# Patient Record
Sex: Female | Born: 2000
Health system: Southern US, Community
[De-identification: ages and names within clinical notes are randomized; demographics above are authoritative.]

## PROBLEM LIST (undated history)

## (undated) DIAGNOSIS — Z889 Allergy status to unspecified drugs, medicaments and biological substances status: Secondary | ICD-10-CM

## (undated) DIAGNOSIS — D691 Qualitative platelet defects: Secondary | ICD-10-CM

---
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
## 2018-11-13 ENCOUNTER — Encounter (HOSPITAL_COMMUNITY): Payer: Self-pay

## 2018-11-13 ENCOUNTER — Ambulatory Visit (INDEPENDENT_AMBULATORY_CARE_PROVIDER_SITE_OTHER): Payer: BC Managed Care – PPO

## 2018-11-13 ENCOUNTER — Ambulatory Visit (HOSPITAL_COMMUNITY)
Admission: EM | Admit: 2018-11-13 | Discharge: 2018-11-13 | Disposition: A | Discharge status: Home / Self Care | Payer: BC Managed Care – PPO | Attending: Family Medicine | Admitting: Family Medicine

## 2018-11-13 ENCOUNTER — Other Ambulatory Visit: Payer: Self-pay

## 2018-11-13 DIAGNOSIS — K5900 Constipation, unspecified: Secondary | ICD-10-CM | POA: Diagnosis not present

## 2018-11-13 DIAGNOSIS — Z3202 Encounter for pregnancy test, result negative: Secondary | ICD-10-CM

## 2018-11-13 DIAGNOSIS — R109 Unspecified abdominal pain: Secondary | ICD-10-CM | POA: Diagnosis not present

## 2018-11-13 DIAGNOSIS — R1084 Generalized abdominal pain: Secondary | ICD-10-CM | POA: Diagnosis not present

## 2018-11-13 LAB — POCT URINALYSIS DIP (DEVICE)
Bilirubin Urine: NEGATIVE
Glucose, UA: NEGATIVE mg/dL
Hgb urine dipstick: NEGATIVE
Ketones, ur: NEGATIVE mg/dL
Leukocytes,Ua: NEGATIVE
Nitrite: NEGATIVE
Protein, ur: NEGATIVE mg/dL
Specific Gravity, Urine: >=1.03 (ref 1.005–1.030)
Urobilinogen, UA: 1 mg/dL (ref 0.0–1.0)
pH: 6 (ref 5.0–8.0)

## 2018-11-13 LAB — POCT PREGNANCY, URINE: Preg Test, Ur: NEGATIVE

## 2018-11-13 NOTE — ED Triage Notes (Signed)
Pt state she has had a stomach pains x 1 week.

## 2018-11-13 NOTE — Discharge Instructions (Addendum)
You may try over-the-counter Miralax and Magnesium Citrate as directed on the packaging.  You have been seen today for abdominal pain. Your evaluation was not suggestive of any emergent condition requiring medical intervention at this time. However, some abdominal problems make take more time to appear. Therefore, it is very important for you to pay attention to any new symptoms or worsening of your current condition.  Please return here or to the Emergency Department immediately should you begin to feel worse in any way or have any of the following symptoms: increasing or different abdominal pain, persistent vomiting, inability to drink fluids, fevers, or shaking chills.

## 2018-11-13 NOTE — ED Provider Notes (Signed)
[facility]   [id number] 11/13/18 Arrival Time: 5809  ASSESSMENT & PLAN:  1. Generalized abdominal pain   2. Constipation, unspecified constipation type    I have personally viewed the imaging studies ordered this visit. No worrisome findings. Much stool.  Benign abdominal exam. No indications for urgent abdominal/pelvic imaging at this time. Discussed.   Discharge Instructions     You may try over-the-counter Miralax and Magnesium Citrate as directed on the packaging.  You have been seen today for abdominal pain. Your evaluation was not suggestive of any emergent condition requiring medical intervention at this time. However, some abdominal problems make take more time to appear. Therefore, it is very important for you to pay attention to any new symptoms or worsening of your current condition.  Please return here or to the Emergency Department immediately should you begin to feel worse in any way or have any of the following symptoms: increasing or different abdominal pain, persistent vomiting, inability to drink fluids, fevers, or shaking chills.        Reviewed expectations re: course of current medical issues. Questions answered. Outlined signs and symptoms indicating need for more acute intervention. Patient verbalized understanding. After Visit Summary given.   SUBJECTIVE: History from: patient. Gina Zimmerman is a 18 y.o. female who presents with complaint of intermittent generalized abdominal discomfort. Onset gradual, 5-6 days ago. Discomfort described as "full feeling"; without radiation. Symptoms are unchanged since beginning. Slightly better after a smaller-than-normal bowel movements several days ago. Fever: absent. Aggravating factors: have not been identified. Alleviating factors: have not been identified. Associated symptoms: mild fatigue. She denies arthralgias, diarrhea, fever, headache, myalgias, nausea, sweats and vomiting. Appetite: normal. PO  intake: normal. Ambulatory without assistance. Urinary symptoms: none. Bowel movements: are less frequent than before; last normal bowel movement just before current symptoms started; without blood. No new medications. History of similar: none reported. OTC treatment: Miralax; one dose.  Patient's last menstrual period was 11/02/2018.   History reviewed. No pertinent surgical history.  ROS: As per HPI. All other systems negative.  OBJECTIVE:  Vitals:   11/13/18 1147 11/13/18 1151  BP:  121/76  Pulse:  86  Resp:  16  Temp:  98.6 F (37 C)  TempSrc:  Oral  SpO2:  100%  Weight: 72.6 kg     General appearance: alert, oriented, no acute distress  Oropharynx: moist Lungs: clear to auscultation bilaterally; unlabored respirations Heart: regular rate and rhythm Abdomen: soft; without distention; mild generalized tenderness - "just feels sore"; normal bowel sounds; without masses or organomegaly; without guarding or rebound tenderness Back: without CVA tenderness; FROM at waist Extremities: without LE edema; symmetrical; without gross deformities Skin: warm and dry Neurologic: normal gait Psychological: alert and cooperative; normal mood and affect  Labs: Results for orders placed or performed during the hospital encounter of 11/13/18  POCT urinalysis dip (device)  Result Value Ref Range   Glucose, UA NEGATIVE NEGATIVE mg/dL   Bilirubin Urine NEGATIVE NEGATIVE   Ketones, ur NEGATIVE NEGATIVE mg/dL   Specific Gravity, Urine >=1.030 1.005 - 1.030   Hgb urine dipstick NEGATIVE NEGATIVE   pH 6.0 5.0 - 8.0   Protein, ur NEGATIVE NEGATIVE mg/dL   Urobilinogen, UA 1.0 0.0 - 1.0 mg/dL   Nitrite NEGATIVE NEGATIVE   Leukocytes,Ua NEGATIVE NEGATIVE  Pregnancy, urine POC  Result Value Ref Range   Preg Test, Ur NEGATIVE NEGATIVE   Labs Reviewed  POC URINE PREG, ED  POCT URINALYSIS DIP (DEVICE)  POCT PREGNANCY,  URINE    No Known Allergies                                              PMH: Constipation  Social History   Socioeconomic History   Marital status: Single    Spouse name: Not on file   Number of children: Not on file   Years of education: Not on file   Highest education level: Not on file  Occupational History   Not on file  Social Needs   Financial resource strain: Not on file   Food insecurity    Worry: Not on file    Inability: Not on file   Transportation needs    Medical: Not on file    Non-medical: Not on file  Tobacco Use   Smoking status: Never Smoker   Smokeless tobacco: Never Used  Substance and Sexual Activity   Alcohol use: Yes   Drug use: No   Sexual activity: Yes    Birth control/protection: Implant  Lifestyle   Physical activity    Days per week: Not on file    Minutes per session: Not on file   Stress: Not on file  Relationships   Social connections    Talks on phone: Not on file    Gets together: Not on file    Attends religious service: Not on file    Active member of club or organization: Not on file    Attends meetings of clubs or organizations: Not on file    Relationship status: Not on file   Intimate partner violence    Fear of current or ex partner: Not on file    Emotionally abused: Not on file    Physically abused: Not on file    Forced sexual activity: Not on file  Other Topics Concern   Not on file  Social History Narrative   Not on file   FH: HTN   Mardella LaymanHagler, Dorthula Bier, MD 11/13/18 1317

## 2018-12-05 ENCOUNTER — Other Ambulatory Visit: Payer: Self-pay

## 2018-12-05 ENCOUNTER — Encounter (HOSPITAL_COMMUNITY): Payer: Self-pay

## 2018-12-05 ENCOUNTER — Ambulatory Visit (HOSPITAL_COMMUNITY)
Admission: EM | Admit: 2018-12-05 | Discharge: 2018-12-05 | Disposition: A | Discharge status: Home / Self Care | Payer: BC Managed Care – PPO | Attending: Emergency Medicine | Admitting: Emergency Medicine

## 2018-12-05 DIAGNOSIS — R1084 Generalized abdominal pain: Secondary | ICD-10-CM | POA: Diagnosis not present

## 2018-12-05 LAB — POCT URINALYSIS DIP (DEVICE)
Bilirubin Urine: NEGATIVE
Glucose, UA: NEGATIVE mg/dL
Hgb urine dipstick: NEGATIVE
Ketones, ur: NEGATIVE mg/dL
Leukocytes,Ua: NEGATIVE
Nitrite: NEGATIVE
Protein, ur: NEGATIVE mg/dL
Specific Gravity, Urine: >=1.03 (ref 1.005–1.030)
Urobilinogen, UA: 1 mg/dL (ref 0.0–1.0)
pH: 6 (ref 5.0–8.0)

## 2018-12-05 NOTE — Discharge Instructions (Addendum)
Your urine test is normal today.    Your vaginal swab for STDs is pending.  If anything comes back positive indicating treatment, we will call you.    Establish yourself with a primary care provider to receive additional evaluation as needed.    Go to the emergency department if you develop acute abdominal pain.  Return here or go to the emergency department if you develop vomiting, diarrhea, fever, difficulty with urination, back pain, vaginal discharge, pelvic pain, other concerning symptoms.

## 2018-12-05 NOTE — ED Notes (Signed)
Sent to bathroom for urine and vaginal swabbing with instructions

## 2018-12-05 NOTE — ED Provider Notes (Signed)
[name]    CSN: [id number] Arrival date & time: 12/05/18  1354     History   Chief Complaint Chief Complaint  Patient presents with  . Abdominal Pain    HPI Gina Zimmerman is a 18 y.o. female.   Patient presents with generalized abdominal pain and constipation x2 weeks; This is been an intermittent problem for her for the last 2 years.  She states the pain is an "ache" and is relieved with a bowel movement.  Last bowel movement: Yesterday; brown, formed, no blood.  She denies nausea, vomiting, diarrhea, fever, dysuria, flank pain, vaginal discharge, pelvic pain.  LMP: 11/30/2018.  She states she did have unprotected sex about 3 weeks ago.    The history is provided by the patient.    History reviewed. No pertinent past medical history.  There are no active problems to display for this patient.   History reviewed. No pertinent surgical history.  OB History   No obstetric history on file.      Home Medications    Prior to Admission medications   Medication Sig Start Date End Date Taking? Authorizing Provider  famotidine (PEPCID) 20 MG tablet Take 1 tablet (20 mg total) by mouth 2 (two) times daily. 01/23/17   Virgel Manifold, MD  ondansetron (ZOFRAN) 4 MG tablet Take 1 tablet (4 mg total) by mouth every 6 (six) hours. 01/23/17   Virgel Manifold, MD    Family History History reviewed. No pertinent family history.  Social History Social History   Tobacco Use  . Smoking status: Never Smoker  . Smokeless tobacco: Never Used  Substance Use Topics  . Alcohol use: Yes  . Drug use: No     Allergies   Patient has no known allergies.   Review of Systems Review of Systems  Constitutional: Negative for chills and fever.  HENT: Negative for ear pain and sore throat.   Eyes: Negative for pain and visual disturbance.  Respiratory: Negative for cough and shortness of breath.   Cardiovascular: Negative for chest pain and palpitations.  Gastrointestinal:  Positive for abdominal pain and constipation. Negative for blood in stool and vomiting.  Genitourinary: Negative for dysuria, flank pain, hematuria, pelvic pain and vaginal discharge.  Musculoskeletal: Negative for arthralgias and back pain.  Skin: Negative for color change and rash.  Neurological: Negative for seizures and syncope.  All other systems reviewed and are negative.    Physical Exam Triage Vital Signs ED Triage Vitals  Enc Vitals Group     BP 12/05/18 1459 134/81     Pulse Rate 12/05/18 1459 78     Resp 12/05/18 1459 14     Temp 12/05/18 1459 98.1 F (36.7 C)     Temp Source 12/05/18 1459 Oral     SpO2 12/05/18 1459 100 %     Weight 12/05/18 1509 160 lb (72.6 kg)     Height --      Head Circumference --      Peak Flow --      Pain Score 12/05/18 1509 7     Pain Loc --      Pain Edu? --      Excl. in [place]? --    No data found.  Updated Vital Signs BP 134/81 (BP Location: Left Arm)   Pulse 78   Temp 98.1 F (36.7 C) (Oral)   Resp 14   Wt 160 lb (72.6 kg)   LMP 11/30/2018   SpO2 100%  BMI 28.34 kg/m   Visual Acuity Right Eye Distance:   Left Eye Distance:   Bilateral Distance:    Right Eye Near:   Left Eye Near:    Bilateral Near:     Physical Exam Vitals signs and nursing note reviewed.  Constitutional:      General: She is not in acute distress.    Appearance: She is well-developed.  HENT:     Head: Normocephalic and atraumatic.     Mouth/Throat:     Mouth: Mucous membranes are moist.  Eyes:     Conjunctiva/sclera: Conjunctivae normal.  Neck:     Musculoskeletal: Neck supple.  Cardiovascular:     Rate and Rhythm: Normal rate and regular rhythm.     Heart sounds: No murmur.  Pulmonary:     Effort: Pulmonary effort is normal. No respiratory distress.     Breath sounds: Normal breath sounds.  Abdominal:     Palpations: Abdomen is soft.     Tenderness: There is no abdominal tenderness. There is no right CVA tenderness, left CVA  tenderness, guarding or rebound.  Skin:    General: Skin is warm and dry.  Neurological:     Mental Status: She is alert.      UC Treatments / Results  Labs (all labs ordered are listed, but only abnormal results are displayed) Labs Reviewed  POCT URINALYSIS DIP (DEVICE)  CERVICOVAGINAL ANCILLARY ONLY    EKG   Radiology No results found.  Procedures Procedures (including critical care time)  Medications Ordered in UC Medications - No data to display  Initial Impression / Assessment and Plan / UC Course  I have reviewed the triage vital signs and the nursing notes.  Pertinent labs & imaging results that were available during my care of the patient were reviewed by me and considered in my medical decision making (see chart for details).   Abdominal pain, intermittent and chronic.  Exam unremarkable.  Urine negative.  Vaginal swab sent for gonorrhea, chlamydia, trichomonas, BV.  Low suspicion for STD; patient declines treatment today; she states she will wait for test results to come back and receive treatment then if needed.  Instructed patient to abstain from sex until her STD test are back.;  Discussed that her sexual partner will need treatment also if her test are positive.  Instructed patient to go to the emergency department if she develops acute abdominal pain.  Discussed that she needed to establish herself with a primary care provider to receive additional evaluation is needed.  Instructed patient to return here or go to the emergency department if she develops vomiting, diarrhea, fever, dysuria, back pain, vaginal discharge, pelvic pain, other concerning symptoms.      Final Clinical Impressions(s) / UC Diagnoses   Final diagnoses:  Generalized abdominal pain     Discharge Instructions     Your urine test is normal today.    Your vaginal swab for STDs is pending.  If anything comes back positive indicating treatment, we will call you.    Establish yourself  with a primary care provider to receive additional evaluation as needed.    Go to the emergency department if you develop acute abdominal pain.  Return here or go to the emergency department if you develop vomiting, diarrhea, fever, difficulty with urination, back pain, vaginal discharge, pelvic pain, other concerning symptoms.        ED Prescriptions    None     Controlled Substance Prescriptions [place] Controlled Substance Registry  consulted? Not Applicable   Mickie Bailate, Halima Fogal H, NP 12/05/18 [phone]

## 2018-12-05 NOTE — ED Triage Notes (Signed)
Pt states she has stomach pain x 2 weeks.

## 2018-12-07 LAB — CERVICOVAGINAL ANCILLARY ONLY
Bacterial vaginitis: NEGATIVE
Chlamydia: NEGATIVE
Neisseria Gonorrhea: NEGATIVE
Trichomonas: NEGATIVE

## 2019-04-18 ENCOUNTER — Other Ambulatory Visit: Payer: Self-pay

## 2019-04-18 DIAGNOSIS — Z20822 Contact with and (suspected) exposure to covid-19: Secondary | ICD-10-CM

## 2019-04-21 LAB — NOVEL CORONAVIRUS, NAA: SARS-CoV-2, NAA: NOT DETECTED

## 2019-04-24 DIAGNOSIS — Z1159 Encounter for screening for other viral diseases: Secondary | ICD-10-CM | POA: Diagnosis not present

## 2019-04-24 DIAGNOSIS — Z20828 Contact with and (suspected) exposure to other viral communicable diseases: Secondary | ICD-10-CM | POA: Diagnosis not present

## 2019-05-21 DIAGNOSIS — Z20828 Contact with and (suspected) exposure to other viral communicable diseases: Secondary | ICD-10-CM | POA: Diagnosis not present

## 2019-05-21 DIAGNOSIS — Z1159 Encounter for screening for other viral diseases: Secondary | ICD-10-CM | POA: Diagnosis not present

## 2019-05-27 DIAGNOSIS — Z1159 Encounter for screening for other viral diseases: Secondary | ICD-10-CM | POA: Diagnosis not present

## 2019-05-27 DIAGNOSIS — Z20828 Contact with and (suspected) exposure to other viral communicable diseases: Secondary | ICD-10-CM | POA: Diagnosis not present

## 2019-06-03 DIAGNOSIS — Z1159 Encounter for screening for other viral diseases: Secondary | ICD-10-CM | POA: Diagnosis not present

## 2019-06-03 DIAGNOSIS — Z20828 Contact with and (suspected) exposure to other viral communicable diseases: Secondary | ICD-10-CM | POA: Diagnosis not present

## 2019-06-10 DIAGNOSIS — Z20828 Contact with and (suspected) exposure to other viral communicable diseases: Secondary | ICD-10-CM | POA: Diagnosis not present

## 2019-06-10 DIAGNOSIS — Z1159 Encounter for screening for other viral diseases: Secondary | ICD-10-CM | POA: Diagnosis not present

## 2019-06-17 DIAGNOSIS — Z1159 Encounter for screening for other viral diseases: Secondary | ICD-10-CM | POA: Diagnosis not present

## 2019-06-17 DIAGNOSIS — Z20828 Contact with and (suspected) exposure to other viral communicable diseases: Secondary | ICD-10-CM | POA: Diagnosis not present

## 2019-06-18 ENCOUNTER — Emergency Department (HOSPITAL_COMMUNITY): Payer: BC Managed Care – PPO | Admitting: Certified Registered"

## 2019-06-18 ENCOUNTER — Emergency Department (HOSPITAL_COMMUNITY): Payer: BC Managed Care – PPO

## 2019-06-18 ENCOUNTER — Other Ambulatory Visit: Payer: Self-pay

## 2019-06-18 ENCOUNTER — Ambulatory Visit (HOSPITAL_COMMUNITY)
Admission: EM | Admit: 2019-06-18 | Discharge: 2019-06-18 | Disposition: A | Discharge status: Home / Self Care | Payer: BC Managed Care – PPO | Source: Home / Self Care

## 2019-06-18 ENCOUNTER — Ambulatory Visit (HOSPITAL_COMMUNITY)
Admission: EM | Admit: 2019-06-18 | Discharge: 2019-06-18 | Disposition: A | Discharge status: Home / Self Care | Payer: BC Managed Care – PPO | Attending: Surgery | Admitting: Surgery

## 2019-06-18 ENCOUNTER — Encounter (HOSPITAL_COMMUNITY)
Admission: EM | Disposition: A | Discharge status: Home / Self Care | Payer: Self-pay | Source: Home / Self Care | Attending: Emergency Medicine

## 2019-06-18 ENCOUNTER — Encounter (HOSPITAL_COMMUNITY): Payer: Self-pay

## 2019-06-18 ENCOUNTER — Encounter (HOSPITAL_COMMUNITY): Payer: Self-pay | Admitting: Emergency Medicine

## 2019-06-18 DIAGNOSIS — Z3202 Encounter for pregnancy test, result negative: Secondary | ICD-10-CM

## 2019-06-18 DIAGNOSIS — K353 Acute appendicitis with localized peritonitis, without perforation or gangrene: Secondary | ICD-10-CM

## 2019-06-18 DIAGNOSIS — R112 Nausea with vomiting, unspecified: Secondary | ICD-10-CM

## 2019-06-18 DIAGNOSIS — R1013 Epigastric pain: Secondary | ICD-10-CM

## 2019-06-18 DIAGNOSIS — R1011 Right upper quadrant pain: Secondary | ICD-10-CM | POA: Diagnosis not present

## 2019-06-18 DIAGNOSIS — R109 Unspecified abdominal pain: Secondary | ICD-10-CM | POA: Diagnosis not present

## 2019-06-18 DIAGNOSIS — K3580 Unspecified acute appendicitis: Secondary | ICD-10-CM | POA: Insufficient documentation

## 2019-06-18 DIAGNOSIS — Z20822 Contact with and (suspected) exposure to covid-19: Secondary | ICD-10-CM | POA: Insufficient documentation

## 2019-06-18 HISTORY — PX: LAPAROSCOPIC APPENDECTOMY: SHX408

## 2019-06-18 LAB — COMPREHENSIVE METABOLIC PANEL
ALT: 98 U/L — ABNORMAL HIGH (ref 0–44)
AST: 53 U/L — ABNORMAL HIGH (ref 15–41)
Albumin: 4.3 g/dL (ref 3.5–5.0)
Alkaline Phosphatase: 122 U/L (ref 38–126)
Anion gap: 11 (ref 5–15)
BUN: 8 mg/dL (ref 6–20)
CO2: 24 mmol/L (ref 22–32)
Calcium: 9.6 mg/dL (ref 8.9–10.3)
Chloride: 103 mmol/L (ref 98–111)
Creatinine, Ser: 0.68 mg/dL (ref 0.44–1.00)
GFR calc Af Amer: >60 mL/min (ref >60)
GFR calc non Af Amer: >60 mL/min (ref >60)
Glucose, Bld: 113 mg/dL — ABNORMAL HIGH (ref 70–99)
Potassium: 3.7 mmol/L (ref 3.5–5.1)
Sodium: 138 mmol/L (ref 135–145)
Total Bilirubin: 0.6 mg/dL (ref 0.3–1.2)
Total Protein: 7.8 g/dL (ref 6.5–8.1)

## 2019-06-18 LAB — URINALYSIS, ROUTINE W REFLEX MICROSCOPIC
Bacteria, UA: NONE SEEN
Bilirubin Urine: NEGATIVE
Glucose, UA: NEGATIVE mg/dL
Hgb urine dipstick: NEGATIVE
Ketones, ur: NEGATIVE mg/dL
Leukocytes,Ua: NEGATIVE
Nitrite: NEGATIVE
Protein, ur: NEGATIVE mg/dL
Specific Gravity, Urine: 1.021 (ref 1.005–1.030)
pH: 5 (ref 5.0–8.0)

## 2019-06-18 LAB — I-STAT BETA HCG BLOOD, ED (MC, WL, AP ONLY): I-stat hCG, quantitative: <5 m[IU]/mL (ref <5)

## 2019-06-18 LAB — POCT URINALYSIS DIP (DEVICE)
Bilirubin Urine: NEGATIVE
Glucose, UA: NEGATIVE mg/dL
Hgb urine dipstick: NEGATIVE
Ketones, ur: NEGATIVE mg/dL
Leukocytes,Ua: NEGATIVE
Nitrite: NEGATIVE
Protein, ur: NEGATIVE mg/dL
Specific Gravity, Urine: >=1.03 (ref 1.005–1.030)
Urobilinogen, UA: 0.2 mg/dL (ref 0.0–1.0)
pH: 5 (ref 5.0–8.0)

## 2019-06-18 LAB — CBC
HCT: 43.4 % (ref 36.0–46.0)
Hemoglobin: 13.5 g/dL (ref 12.0–15.0)
MCH: 25.2 pg — ABNORMAL LOW (ref 26.0–34.0)
MCHC: 31.1 g/dL (ref 30.0–36.0)
MCV: 81 fL (ref 80.0–100.0)
Platelets: 739 10*3/uL — ABNORMAL HIGH (ref 150–400)
RBC: 5.36 MIL/uL — ABNORMAL HIGH (ref 3.87–5.11)
RDW: 21 % — ABNORMAL HIGH (ref 11.5–15.5)
WBC: 15.5 10*3/uL — ABNORMAL HIGH (ref 4.0–10.5)
nRBC: 0.2 % (ref 0.0–0.2)

## 2019-06-18 LAB — LIPASE, BLOOD: Lipase: 17 U/L (ref 11–51)

## 2019-06-18 LAB — RESPIRATORY PANEL BY RT PCR (FLU A&B, COVID)
Influenza A by PCR: NEGATIVE
Influenza B by PCR: NEGATIVE
SARS Coronavirus 2 by RT PCR: NEGATIVE

## 2019-06-18 LAB — POCT PREGNANCY, URINE: Preg Test, Ur: NEGATIVE

## 2019-06-18 LAB — POC URINE PREG, ED: Preg Test, Ur: NEGATIVE

## 2019-06-18 SURGERY — APPENDECTOMY, LAPAROSCOPIC
Anesthesia: General | Site: Abdomen

## 2019-06-18 MED ORDER — PROPOFOL 10 MG/ML IV BOLUS
INTRAVENOUS | Status: AC
  Filled 2019-06-18: qty 20

## 2019-06-18 MED ORDER — SCOPOLAMINE 1 MG/3DAYS TD PT72: 1.0000 | MEDICATED_PATCH | TRANSDERMAL | Status: DC

## 2019-06-18 MED ORDER — SUCCINYLCHOLINE 20MG/ML (10ML) SYRINGE FOR MEDFUSION PUMP - OPTIME
INTRAMUSCULAR | Status: DC | PRN
  Administered 2019-06-18: 140 mg via INTRAVENOUS

## 2019-06-18 MED ORDER — SODIUM CHLORIDE 0.9 % IR SOLN
Status: DC | PRN
  Administered 2019-06-18: 1000 mL

## 2019-06-18 MED ORDER — MORPHINE SULFATE (PF) 2 MG/ML IV SOLN: 1.0000 mg | INTRAVENOUS | Status: DC | PRN

## 2019-06-18 MED ORDER — BUPIVACAINE HCL (PF) 0.25 % IJ SOLN
INTRAMUSCULAR | Status: AC
  Filled 2019-06-18: qty 30

## 2019-06-18 MED ORDER — ALUM & MAG HYDROXIDE-SIMETH 200-200-20 MG/5ML PO SUSP
30.0000 mL | Freq: Once | ORAL | Status: AC
  Administered 2019-06-18: 30 mL via ORAL
  Filled 2019-06-18: qty 30

## 2019-06-18 MED ORDER — SODIUM CHLORIDE 0.9 % IV BOLUS
1000.0000 mL | Freq: Once | INTRAVENOUS | Status: AC
  Administered 2019-06-18: 1000 mL via INTRAVENOUS

## 2019-06-18 MED ORDER — DOCUSATE SODIUM 100 MG PO CAPS: 100.0000 mg | ORAL_CAPSULE | Freq: Two times a day (BID) | ORAL | 0 refills | Status: AC

## 2019-06-18 MED ORDER — SODIUM CHLORIDE 0.9 % IV SOLN
2.0000 g | Freq: Once | INTRAVENOUS | Status: AC
  Administered 2019-06-18: 17:00:00 2 g via INTRAVENOUS
  Filled 2019-06-18: qty 20

## 2019-06-18 MED ORDER — SCOPOLAMINE 1 MG/3DAYS TD PT72
MEDICATED_PATCH | TRANSDERMAL | Status: AC
  Administered 2019-06-18: 1.5 mg via TRANSDERMAL
  Filled 2019-06-18: qty 1

## 2019-06-18 MED ORDER — ACETAMINOPHEN 650 MG RE SUPP: 650.0000 mg | RECTAL | Status: DC | PRN

## 2019-06-18 MED ORDER — ROCURONIUM 10MG/ML (10ML) SYRINGE FOR MEDFUSION PUMP - OPTIME
INTRAVENOUS | Status: DC | PRN
  Administered 2019-06-18: 30 mg via INTRAVENOUS

## 2019-06-18 MED ORDER — GLYCOPYRROLATE 0.2 MG/ML IJ SOLN
INTRAMUSCULAR | Status: DC | PRN
  Administered 2019-06-18: .2 mg via INTRAVENOUS

## 2019-06-18 MED ORDER — PROMETHAZINE HCL 25 MG/ML IJ SOLN: 6.2500 mg | INTRAMUSCULAR | Status: DC | PRN

## 2019-06-18 MED ORDER — LACTATED RINGERS IV SOLN: INTRAVENOUS | Status: DC | PRN

## 2019-06-18 MED ORDER — AMOXICILLIN-POT CLAVULANATE 875-125 MG PO TABS: 1.0000 | ORAL_TABLET | Freq: Two times a day (BID) | ORAL | 0 refills | Status: AC

## 2019-06-18 MED ORDER — FENTANYL CITRATE (PF) 100 MCG/2ML IJ SOLN: 25.0000 ug | INTRAMUSCULAR | Status: DC | PRN

## 2019-06-18 MED ORDER — SUGAMMADEX SODIUM 200 MG/2ML IV SOLN
INTRAVENOUS | Status: DC | PRN
  Administered 2019-06-18: 200 mg via INTRAVENOUS

## 2019-06-18 MED ORDER — 0.9 % SODIUM CHLORIDE (POUR BTL) OPTIME
TOPICAL | Status: DC | PRN
  Administered 2019-06-18: 1000 mL

## 2019-06-18 MED ORDER — METOCLOPRAMIDE HCL 5 MG/ML IJ SOLN
INTRAMUSCULAR | Status: AC
  Filled 2019-06-18: qty 2

## 2019-06-18 MED ORDER — ONDANSETRON HCL 4 MG/2ML IJ SOLN
INTRAMUSCULAR | Status: AC
  Filled 2019-06-18: qty 2

## 2019-06-18 MED ORDER — METOCLOPRAMIDE HCL 5 MG/ML IJ SOLN
10.0000 mg | INTRAMUSCULAR | Status: AC
  Administered 2019-06-18: 09:00:00 10 mg via INTRAMUSCULAR

## 2019-06-18 MED ORDER — SODIUM CHLORIDE 0.9% FLUSH: 3.0000 mL | INTRAVENOUS | Status: DC | PRN

## 2019-06-18 MED ORDER — ONDANSETRON 4 MG PO TBDP
4.0000 mg | ORAL_TABLET | Freq: Once | ORAL | Status: AC
  Administered 2019-06-18: 09:00:00 4 mg via ORAL

## 2019-06-18 MED ORDER — ACETAMINOPHEN 500 MG PO TABS
1000.0000 mg | ORAL_TABLET | ORAL | Status: DC
  Filled 2019-06-18: qty 2

## 2019-06-18 MED ORDER — FENTANYL CITRATE (PF) 250 MCG/5ML IJ SOLN
INTRAMUSCULAR | Status: DC | PRN
  Administered 2019-06-18 (×3): 50 ug via INTRAVENOUS

## 2019-06-18 MED ORDER — LIDOCAINE HCL (CARDIAC) PF 100 MG/5ML IV SOSY
PREFILLED_SYRINGE | INTRAVENOUS | Status: DC | PRN
  Administered 2019-06-18: 100 mg via INTRATRACHEAL

## 2019-06-18 MED ORDER — BUPIVACAINE HCL (PF) 0.25 % IJ SOLN
INTRAMUSCULAR | Status: DC | PRN
  Administered 2019-06-18: 14 mL

## 2019-06-18 MED ORDER — ONDANSETRON HCL 4 MG/2ML IJ SOLN
4.0000 mg | Freq: Once | INTRAMUSCULAR | Status: AC
  Administered 2019-06-18: 10:00:00 4 mg via INTRAVENOUS
  Filled 2019-06-18: qty 2

## 2019-06-18 MED ORDER — FENTANYL CITRATE (PF) 250 MCG/5ML IJ SOLN
INTRAMUSCULAR | Status: AC
  Filled 2019-06-18: qty 5

## 2019-06-18 MED ORDER — ACETAMINOPHEN 325 MG PO TABS
650.0000 mg | ORAL_TABLET | Freq: Once | ORAL | Status: AC
  Administered 2019-06-18: 650 mg via ORAL
  Filled 2019-06-18: qty 2

## 2019-06-18 MED ORDER — CELECOXIB 200 MG PO CAPS: 400.0000 mg | ORAL_CAPSULE | Freq: Once | ORAL | Status: DC

## 2019-06-18 MED ORDER — ONDANSETRON HCL 4 MG/2ML IJ SOLN
INTRAMUSCULAR | Status: DC | PRN
  Administered 2019-06-18: 4 mg via INTRAVENOUS

## 2019-06-18 MED ORDER — ONDANSETRON HCL 4 MG/2ML IJ SOLN
4.0000 mg | INTRAMUSCULAR | Status: DC | PRN
  Administered 2019-06-18: 4 mg via INTRAVENOUS

## 2019-06-18 MED ORDER — MIDAZOLAM HCL 2 MG/2ML IJ SOLN
INTRAMUSCULAR | Status: DC | PRN
  Administered 2019-06-18: 2 mg via INTRAVENOUS

## 2019-06-18 MED ORDER — CELECOXIB 200 MG PO CAPS
200.0000 mg | ORAL_CAPSULE | ORAL | Status: AC
  Administered 2019-06-18: 200 mg via ORAL
  Filled 2019-06-18 (×2): qty 1

## 2019-06-18 MED ORDER — CELECOXIB 200 MG PO CAPS
200.0000 mg | ORAL_CAPSULE | Freq: Once | ORAL | Status: AC
  Filled 2019-06-18: qty 1

## 2019-06-18 MED ORDER — CELECOXIB 200 MG PO CAPS
ORAL_CAPSULE | ORAL | Status: AC
  Administered 2019-06-18: 200 mg via ORAL
  Filled 2019-06-18: qty 1

## 2019-06-18 MED ORDER — MIDAZOLAM HCL 2 MG/2ML IJ SOLN
INTRAMUSCULAR | Status: AC
  Filled 2019-06-18: qty 2

## 2019-06-18 MED ORDER — SODIUM CHLORIDE 0.9% FLUSH: 3.0000 mL | Freq: Two times a day (BID) | INTRAVENOUS | Status: DC

## 2019-06-18 MED ORDER — METRONIDAZOLE IN NACL 5-0.79 MG/ML-% IV SOLN
500.0000 mg | Freq: Once | INTRAVENOUS | Status: AC
  Administered 2019-06-18: 500 mg via INTRAVENOUS
  Filled 2019-06-18: qty 100

## 2019-06-18 MED ORDER — PHENYLEPHRINE 40 MCG/ML (10ML) SYRINGE FOR IV PUSH (FOR BLOOD PRESSURE SUPPORT)
PREFILLED_SYRINGE | INTRAVENOUS | Status: AC
  Filled 2019-06-18: qty 10

## 2019-06-18 MED ORDER — DEXAMETHASONE SODIUM PHOSPHATE 10 MG/ML IJ SOLN
INTRAMUSCULAR | Status: DC | PRN
  Administered 2019-06-18: 10 mg via INTRAVENOUS

## 2019-06-18 MED ORDER — CHLORHEXIDINE GLUCONATE CLOTH 2 % EX PADS: 6.0000 | MEDICATED_PAD | Freq: Once | CUTANEOUS | Status: DC

## 2019-06-18 MED ORDER — PROPOFOL 10 MG/ML IV BOLUS
INTRAVENOUS | Status: DC | PRN
  Administered 2019-06-18: 200 mg via INTRAVENOUS

## 2019-06-18 MED ORDER — SODIUM CHLORIDE 0.9% FLUSH
3.0000 mL | Freq: Once | INTRAVENOUS | Status: AC
  Administered 2019-06-18: 3 mL via INTRAVENOUS

## 2019-06-18 MED ORDER — STERILE WATER FOR IRRIGATION IR SOLN
Status: DC | PRN
  Administered 2019-06-18: 1000 mL

## 2019-06-18 MED ORDER — SODIUM CHLORIDE 0.9 % IV SOLN: INTRAVENOUS | Status: DC

## 2019-06-18 MED ORDER — SODIUM CHLORIDE 0.9 % IV SOLN
INTRAVENOUS | Status: DC
  Filled 2019-06-18: qty 1000

## 2019-06-18 MED ORDER — ACETAMINOPHEN 500 MG PO TABS
1000.0000 mg | ORAL_TABLET | Freq: Once | ORAL | Status: AC
  Administered 2019-06-18: 1000 mg via ORAL

## 2019-06-18 MED ORDER — IOHEXOL 300 MG/ML  SOLN
100.0000 mL | Freq: Once | INTRAMUSCULAR | Status: AC | PRN
  Administered 2019-06-18: 100 mL via INTRAVENOUS

## 2019-06-18 MED ORDER — ONDANSETRON 4 MG PO TBDP: 4.0000 mg | ORAL_TABLET | Freq: Three times a day (TID) | ORAL | 0 refills | Status: DC | PRN

## 2019-06-18 MED ORDER — OXYCODONE HCL 5 MG PO TABS: 5.0000 mg | ORAL_TABLET | ORAL | Status: DC | PRN

## 2019-06-18 MED ORDER — ONDANSETRON 4 MG PO TBDP
ORAL_TABLET | ORAL | Status: AC
  Filled 2019-06-18: qty 1

## 2019-06-18 MED ORDER — SODIUM CHLORIDE 0.9 % IV SOLN: 250.0000 mL | INTRAVENOUS | Status: DC | PRN

## 2019-06-18 MED ORDER — TRAMADOL HCL 50 MG PO TABS: 50.0000 mg | ORAL_TABLET | Freq: Four times a day (QID) | ORAL | 0 refills | Status: AC | PRN

## 2019-06-18 MED ORDER — ACETAMINOPHEN 325 MG PO TABS: 650.0000 mg | ORAL_TABLET | ORAL | Status: DC | PRN

## 2019-06-18 MED ORDER — GABAPENTIN 300 MG PO CAPS
300.0000 mg | ORAL_CAPSULE | ORAL | Status: AC
  Administered 2019-06-18: 300 mg via ORAL
  Filled 2019-06-18: qty 1

## 2019-06-18 SURGICAL SUPPLY — 37 items
APPLIER CLIP 5 13 M/L LIGAMAX5 (MISCELLANEOUS)
BLADE CLIPPER SURG (BLADE) IMPLANT
CANISTER SUCT 3000ML PPV (MISCELLANEOUS) ×3 IMPLANT
CHLORAPREP W/TINT 26 (MISCELLANEOUS) ×3 IMPLANT
CLIP APPLIE 5 13 M/L LIGAMAX5 (MISCELLANEOUS) IMPLANT
COVER SURGICAL LIGHT HANDLE (MISCELLANEOUS) ×3 IMPLANT
COVER WAND RF STERILE (DRAPES) ×3 IMPLANT
CUTTER FLEX LINEAR 45M (STAPLE) ×3 IMPLANT
DERMABOND ADVANCED (GAUZE/BANDAGES/DRESSINGS) ×2
DERMABOND ADVANCED .7 DNX12 (GAUZE/BANDAGES/DRESSINGS) ×1 IMPLANT
ELECT REM PT RETURN 9FT ADLT (ELECTROSURGICAL) ×3
ELECTRODE REM PT RTRN 9FT ADLT (ELECTROSURGICAL) ×1 IMPLANT
GOWN STRL REUS W/ TWL LRG LVL3 (GOWN DISPOSABLE) ×2 IMPLANT
GOWN STRL REUS W/TWL LRG LVL3 (GOWN DISPOSABLE) ×4
GRASPER SUT TROCAR 14GX15 (MISCELLANEOUS) ×3 IMPLANT
KIT BASIN OR (CUSTOM PROCEDURE TRAY) ×3 IMPLANT
KIT TURNOVER KIT B (KITS) ×3 IMPLANT
NEEDLE INSUFFLATION 14GA 120MM (NEEDLE) ×3 IMPLANT
NS IRRIG 1000ML POUR BTL (IV SOLUTION) ×3 IMPLANT
PAD ARMBOARD 7.5X6 YLW CONV (MISCELLANEOUS) ×6 IMPLANT
POUCH SPECIMEN RETRIEVAL 10MM (ENDOMECHANICALS) ×3 IMPLANT
RELOAD 45 VASCULAR/THIN (ENDOMECHANICALS) ×3 IMPLANT
RELOAD STAPLE TA45 3.5 REG BLU (ENDOMECHANICALS) IMPLANT
SCISSORS LAP 5X35 DISP (ENDOMECHANICALS) IMPLANT
SET IRRIG TUBING LAPAROSCOPIC (IRRIGATION / IRRIGATOR) ×3 IMPLANT
SET TUBE SMOKE EVAC HIGH FLOW (TUBING) ×3 IMPLANT
SHEARS HARMONIC ACE PLUS 36CM (ENDOMECHANICALS) ×3 IMPLANT
SLEEVE ENDOPATH XCEL 5M (ENDOMECHANICALS) ×3 IMPLANT
SPECIMEN JAR SMALL (MISCELLANEOUS) ×3 IMPLANT
SUT MNCRL AB 4-0 PS2 18 (SUTURE) ×3 IMPLANT
TOWEL GREEN STERILE FF (TOWEL DISPOSABLE) ×3 IMPLANT
TRAY FOL W/BAG SLVR 16FR STRL (SET/KITS/TRAYS/PACK) ×1 IMPLANT
TRAY FOLEY W/BAG SLVR 16FR LF (SET/KITS/TRAYS/PACK) ×2
TRAY LAPAROSCOPIC MC (CUSTOM PROCEDURE TRAY) ×3 IMPLANT
TROCAR XCEL 12X100 BLDLESS (ENDOMECHANICALS) ×3 IMPLANT
TROCAR XCEL NON-BLD 5MMX100MML (ENDOMECHANICALS) ×3 IMPLANT
WATER STERILE IRR 1000ML POUR (IV SOLUTION) ×3 IMPLANT

## 2019-06-18 NOTE — Discharge Instructions (Signed)
[facility] ______[facility] °LAPAROSCOPIC SURGERY: POST OP INSTRUCTIONS °Always review your discharge instruction sheet given to you by the facility where your surgery was performed. °IF YOU HAVE DISABILITY OR FAMILY LEAVE FORMS, YOU MUST BRING THEM TO THE OFFICE FOR PROCESSING.   °DO NOT GIVE THEM TO YOUR DOCTOR. ° °1. A prescription for pain medication may be given to you upon discharge.  Take your pain medication as prescribed, if needed.  If narcotic pain medicine is not needed, then you may take acetaminophen (Tylenol) or ibuprofen (Advil) as needed. °2. Take your usually prescribed medications unless otherwise directed. °3. If you need a refill on your pain medication, please contact your pharmacy.  They will contact our office to request authorization. Prescriptions will not be filled after 5pm or on week-ends. °4. You should follow a light diet the first few days after arrival home, such as soup and crackers, etc.  Be sure to include lots of fluids daily. °5. Most patients will experience some swelling and bruising in the area of the incisions.  Ice packs will help.  Swelling and bruising can take several days to resolve.  °6. It is common to experience some constipation if taking pain medication after surgery.  Increasing fluid intake and taking a stool softener (such as Colace) will usually help or prevent this problem from occurring.  A mild laxative (Milk of Magnesia or Miralax) should be taken according to package instructions if there are no bowel movements after 48 hours. °7. Unless discharge instructions indicate otherwise, you may remove your bandages 24-48 hours after surgery, and you may shower at that time.  You may have steri-strips (small skin tapes) in place directly over the incision.  These strips should be left on the skin for 7-10 days.  If your surgeon used skin glue on the incision, you may shower in 24 hours.  The glue will flake off over the next 2-3 weeks.  Any sutures or  staples will be removed at the office during your follow-up visit. °8. ACTIVITIES:  You may resume regular (light) daily activities beginning the next day--such as daily self-care, walking, climbing stairs--gradually increasing activities as tolerated.  You may have sexual intercourse when it is comfortable.  Refrain from any heavy lifting or straining until approved by your doctor. °a. You may drive when you are no longer taking prescription pain medication, you can comfortably wear a seatbelt, and you can safely maneuver your car and apply brakes. °b. RETURN TO WORK:  __1 week________________________________________________________ °9. You should see your doctor in the office for a follow-up appointment approximately 2-3 weeks after your surgery.  Make sure that you call for this appointment within a day or two after you arrive home to insure a convenient appointment time. °10. OTHER INSTRUCTIONS: __________________________________________________________________________________________________________________________ __________________________________________________________________________________________________________________________ °WHEN TO CALL YOUR DOCTOR: °1. Fever over 101.0 °2. Inability to urinate °3. Continued bleeding from incision. °4. Increased pain, redness, or drainage from the incision. °5. Increasing abdominal pain ° °The clinic staff is available to answer your questions during regular business hours.  Please don’t hesitate to call and ask to speak to one of the nurses for clinical concerns.  If you have a medical emergency, go to the nearest emergency room or call 911.  A surgeon from [facility] is always on call at the hospital. °[address], [address], [place], [place]  [address] ? [address] [address], [place], [place]   [address] °[phone] ? [phone] ? FAX [phone] °Web   site: [email or web address] ° °

## 2019-06-18 NOTE — ED Notes (Signed)
Pt ambulated to RR without difficulty 

## 2019-06-18 NOTE — Anesthesia Procedure Notes (Addendum)
Procedure Name: Intubation Date/Time: 06/18/2019 6:44 PM Performed by: Claris Che, CRNA Pre-anesthesia Checklist: Patient identified, Emergency Drugs available, Suction available, Patient being monitored and Timeout performed Patient Re-evaluated:Patient Re-evaluated prior to induction Oxygen Delivery Method: Circle system utilized Preoxygenation: Pre-oxygenation with 100% oxygen Induction Type: IV induction, Rapid sequence and Cricoid Pressure applied Laryngoscope Size: Mac and 3 Grade View: Grade II Tube type: Oral Tube size: 7.5 mm Number of attempts: 1 Airway Equipment and Method: Stylet Placement Confirmation: ETT inserted through vocal cords under direct vision,  positive ETCO2 and breath sounds checked- equal and bilateral Secured at: 23 cm Tube secured with: Tape Dental Injury: Teeth and Oropharynx as per pre-operative assessment

## 2019-06-18 NOTE — ED Provider Notes (Signed)
[facility] EMERGENCY DEPARTMENT Provider Note   CSN: [id number] Arrival date & time: 06/18/19  0915     History Chief Complaint  Patient presents with  . Abdominal Pain    Gina Zimmerman is a 19 y.o. female with no relevant PMH presents to the ED from the urgent care with a 1 week history of crampy upper abdominal discomfort that became severe in intensity last evening.  This morning she reports 10 out of 10 aching epigastric abdominal pain with associated nausea and nonbloody emesis.  She denies burning sensation.  No obvious aggravating or alleviating factors.  She received Reglan IM at the urgent care, with little effect.  She denies any headache or dizziness, fevers or chills, chest pain or shortness of breath, cough, urinary symptoms, vaginal bleeding or discharge, or any melena or other changes in bowel habits.  Patient denies any tobacco, alcohol, or illicit drug use.   HPI     History reviewed. No pertinent past medical history.  There are no problems to display for this patient.   History reviewed. No pertinent surgical history.   OB History   No obstetric history on file.     No family history on file.  Social History   Tobacco Use  . Smoking status: Never Smoker  . Smokeless tobacco: Never Used  Substance Use Topics  . Alcohol use: Yes  . Drug use: No    Home Medications Prior to Admission medications   Not on File    Allergies    Latex  Review of Systems   Review of Systems  All other systems reviewed and are negative.   Physical Exam Updated Vital Signs BP 127/88   Pulse (!) 105   Temp 98.1 F (36.7 C) (Oral)   Resp 16   LMP 05/22/2019   SpO2 100%   Physical Exam Vitals and nursing note reviewed. Exam conducted with a chaperone present.  Constitutional:      Appearance: Normal appearance. She is ill-appearing.  HENT:     Head: Normocephalic and atraumatic.  Eyes:     General: No scleral icterus.  Conjunctiva/sclera: Conjunctivae normal.  Cardiovascular:     Rate and Rhythm: Regular rhythm. Tachycardia present.     Pulses: Normal pulses.     Heart sounds: Normal heart sounds.  Pulmonary:     Effort: Pulmonary effort is normal. No respiratory distress.     Breath sounds: Normal breath sounds.  Abdominal:     Comments: Soft, nondistended.  Significant TTP in epigastric region, however no guarding.  Mild-moderate TTP in RUQ, mild RLQ.  No TTP elsewhere.  No overlying skin changes.  Normoactive bowel sounds.  No masses appreciated.   Musculoskeletal:     Cervical back: Normal range of motion and neck supple.  Skin:    General: Skin is dry.     Capillary Refill: Capillary refill takes less than 2 seconds.  Neurological:     General: No focal deficit present.     Mental Status: She is alert and oriented to person, place, and time.     GCS: GCS eye subscore is 4. GCS verbal subscore is 5. GCS motor subscore is 6.  Psychiatric:        Mood and Affect: Mood normal.        Behavior: Behavior normal.        Thought Content: Thought content normal.     ED Results / Procedures / Treatments   Labs (all labs ordered  are listed, but only abnormal results are displayed) Labs Reviewed  COMPREHENSIVE METABOLIC PANEL - Abnormal; Notable for the following components:      Result Value   Glucose, Bld 113 (*)    AST 53 (*)    ALT 98 (*)    All other components within normal limits  CBC - Abnormal; Notable for the following components:   WBC 15.5 (*)    RBC 5.36 (*)    MCH 25.2 (*)    RDW 21.0 (*)    Platelets 739 (*)    All other components within normal limits  URINALYSIS, ROUTINE W REFLEX MICROSCOPIC - Abnormal; Notable for the following components:   APPearance TURBID (*)    All other components within normal limits  RESPIRATORY PANEL BY RT PCR (FLU A&B, COVID)  LIPASE, BLOOD  I-STAT BETA HCG BLOOD, ED ([facility], [facility], [facility] ONLY)    EKG None  Radiology CT ABDOMEN PELVIS W  CONTRAST  Result Date: 06/18/2019 CLINICAL DATA:  Right upper quadrant abdominal pain EXAM: CT ABDOMEN AND PELVIS WITH CONTRAST TECHNIQUE: Multidetector CT imaging of the abdomen and pelvis was performed using the standard protocol following bolus administration of intravenous contrast. CONTRAST:  165mL OMNIPAQUE IOHEXOL 300 MG/ML  SOLN COMPARISON:  None. FINDINGS: Lower chest: No acute abnormality. Hepatobiliary: No focal liver abnormality. The gallbladder appears normal. No biliary dilatation. Pancreas: Unremarkable. No pancreatic ductal dilatation or surrounding inflammatory changes. Spleen: Measures 6.8 x 10.6 by 15.4 cm (volume = 580 cm^3). No focal splenic abnormality. Adrenals/Urinary Tract: The adrenal glands appear normal. No mass or hydronephrosis. Urinary bladder appears normal. Stomach/Bowel: Stomach is within normal limits. The appendix is identified within the right lower quadrant of the abdomen and measures 9 mm in thickness. There is a small amount of free fluid within the right lower quadrant of the abdomen and subtle soft tissue stranding. No evidence for bowel perforation or abscess. Vascular/Lymphatic: No significant vascular findings are present. No enlarged abdominal or pelvic lymph nodes. Reproductive: Uterus unremarkable. Prominent cyst in the right ovary measures 2.8 cm, image 79/3. Other: No focal fluid collections identified. Musculoskeletal: No acute or significant osseous findings. IMPRESSION: 1. Imaging findings concerning for acute appendicitis. No evidence for perforation or abscess. Clinical correlation advised. 2. Splenomegaly. Electronically Signed   By: Kerby Moors M.D.   On: 06/18/2019 15:57   US Abdomen Limited  Result Date: 06/18/2019 CLINICAL DATA:  Right upper quadrant abdominal pain. EXAM: ULTRASOUND ABDOMEN LIMITED RIGHT UPPER QUADRANT COMPARISON:  None. FINDINGS: Gallbladder: No gallstones or wall thickening visualized. No sonographic Murphy sign noted by  sonographer. Common bile duct: Diameter: 2.8 mm Liver: Normal echogenicity without focal lesion or biliary dilatation. Portal vein is patent on color Doppler imaging with normal direction of blood flow towards the liver. Other: None. IMPRESSION: Normal right upper quadrant ultrasound examination. Electronically Signed   By: Marijo Sanes M.D.   On: 06/18/2019 12:31    Procedures Procedures (including critical care time)  Medications Ordered in ED Medications  cefTRIAXone (ROCEPHIN) 2 g in sodium chloride 0.9 % 100 mL IVPB (has no administration in time range)    And  metroNIDAZOLE (FLAGYL) IVPB 500 mg (has no administration in time range)  Chlorhexidine Gluconate Cloth 2 % PADS 6 each (has no administration in time range)    And  Chlorhexidine Gluconate Cloth 2 % PADS 6 each (has no administration in time range)  acetaminophen (TYLENOL) tablet 1,000 mg (has no administration in time range)  celecoxib (CELEBREX) capsule 200  mg (has no administration in time range)  gabapentin (NEURONTIN) capsule 300 mg (has no administration in time range)  sodium chloride 0.9 % 1,000 mL with potassium chloride 10 mEq infusion (has no administration in time range)  morphine 2 MG/ML injection 1-4 mg (has no administration in time range)  ondansetron (ZOFRAN) injection 4 mg (has no administration in time range)  sodium chloride flush (NS) 0.9 % injection 3 mL (3 mLs Intravenous Given 06/18/19 1019)  ondansetron (ZOFRAN) injection 4 mg (4 mg Intravenous Given 06/18/19 1019)  sodium chloride 0.9 % bolus 1,000 mL (0 mLs Intravenous Stopped 06/18/19 1205)  alum & mag hydroxide-simeth (MAALOX/MYLANTA) 200-200-20 MG/5ML suspension 30 mL (30 mLs Oral Given 06/18/19 1043)  acetaminophen (TYLENOL) tablet 650 mg (650 mg Oral Given 06/18/19 1256)  iohexol (OMNIPAQUE) 300 MG/ML solution 100 mL (100 mLs Intravenous Contrast Given 06/18/19 1504)  sodium chloride 0.9 % bolus 1,000 mL (1,000 mLs Intravenous New Bag/Given 06/18/19 1640)     ED Course  I have reviewed the triage vital signs and the nursing notes.  Pertinent labs & imaging results that were available during my care of the patient were reviewed by me and considered in my medical decision making (see chart for details).  Clinical Course as of Jun 18 1639  Tue Jun 18, 2019  1640 Spoke with general surgery who will evaluate and admit for appendectomy.    [GG]    Clinical Course User Index [GG] Lorelee New, PA-C   MDM Rules/Calculators/A&P                      Initially ordered Limited RUQ abdominal ultrasound given transaminitis and borderline alkaline phos in addition to her epigastric/RUQ tenderness to palpation.  She did have mild RLQ tenderness on exam, but most was epigastric.  CT abdomen pelvis demonstrated findings concerning for acute appendicitis, without evidence of abscess or perforation.  Started patient on IV ceftriaxone and metronidazole.  Will provide continued pain medication.  Ordered fluids.  2-hour PCR COVID-19 testing ordered and will contact general surgery for admission.  Spoke with general surgery who will evaluate patient and admit for appendectomy.   Final Clinical Impression(s) / ED Diagnoses Final diagnoses:  Acute appendicitis with localized peritonitis, without perforation, abscess, or gangrene    Rx / DC Orders ED Discharge Orders    None       Lorelee New, PA-C 06/18/19 1641    Benjiman Core, MD 06/19/19 [phone]

## 2019-06-18 NOTE — ED Triage Notes (Signed)
Pt presents with epigastric pain since last night with nausea and vomiting.

## 2019-06-18 NOTE — ED Provider Notes (Signed)
[place]    CSN: [id number] Arrival date & time: 06/18/19  0806      History   Chief Complaint Chief Complaint  Patient presents with  . Abdominal Pain    Epigastric    HPI Gina Zimmerman is a 19 y.o. female.   HPI Gina Zimmerman presents for evaluation of mid epigastric pain gradually worsening since last night.  Pain is most notable in the epigastric region of the abdomen however radiates to the periumbilicus.  Patient has a low-grade temperature although denies fever or chills.  Last menstrual period was 05/22/2019.  Denies any known COVID-19 contact.  At present right pain as a 10 out of 10 and is radiating to her mid back.  She was able to tolerate food yesterday however has not vomited since last night.  Abdominal pain was so intense last night she was unable to sleep.  She has not taken any medication at home.  She was vomiting on arrival here at urgent care.  She has been intolerant of fluids.  Attempted to give Zofran under the tongue patient vomited Zofran up as well.  Tachycardic and characterize current midepigastric pain as sharp. This is her third encounter at either urgent care or ER with a complaint of mid-epigastric pain with associated N&V. Patient has not had any GI follow-up. She is unable to associate onset of symptom with any type of food or activity. She denies tobacco, marijuana use, illicit drugs, or alcohol use.  Patient's last menstrual period was 05/22/2019. Denies any recent known exposure to persons Positive for COVID-19.  History reviewed. No pertinent past medical history.  There are no problems to display for this patient.   History reviewed. No pertinent surgical history.  OB History   No obstetric history on file.      Home Medications    Prior to Admission medications   Medication Sig Start Date End Date Taking? Authorizing Provider  famotidine (PEPCID) 20 MG tablet Take 1 tablet (20 mg total) by mouth 2 (two) times daily.  01/23/17   Virgel Manifold, MD  ondansetron (ZOFRAN) 4 MG tablet Take 1 tablet (4 mg total) by mouth every 6 (six) hours. 01/23/17   Virgel Manifold, MD    Family History History reviewed. No pertinent family history.  Social History Social History   Tobacco Use  . Smoking status: Never Smoker  . Smokeless tobacco: Never Used  Substance Use Topics  . Alcohol use: Yes  . Drug use: No     Allergies   Patient has no known allergies.   Review of Systems Review of Systems Pertinent negatives listed in HPI Physical Exam Triage Vital Signs ED Triage Vitals  Enc Vitals Group     BP 06/18/19 0829 114/72     Pulse Rate 06/18/19 0829 (!) 110     Resp 06/18/19 0829 17     Temp 06/18/19 0829 99 F (37.2 C)     Temp Source 06/18/19 0829 Oral     SpO2 06/18/19 0829 100 %     Weight --      Height --      Head Circumference --      Peak Flow --      Pain Score 06/18/19 0830 9     Pain Loc --      Pain Edu? --      Excl. in [place]? --    No data found.  Updated Vital Signs BP 114/72 (BP Location: Left Arm)  Pulse (!) 110   Temp 99 F (37.2 C) (Oral)   Resp 17   LMP 05/22/2019   SpO2 100%   Visual Acuity Right Eye Distance:   Left Eye Distance:   Bilateral Distance:    Right Eye Near:   Left Eye Near:    Bilateral Near:     Physical Exam Constitutional:      General: She is in acute distress.     Appearance: She is ill-appearing. She is not toxic-appearing.  Cardiovascular:     Rate and Rhythm: Tachycardia present.  Pulmonary:     Effort: Pulmonary effort is normal.     Breath sounds: Normal breath sounds.  Abdominal:     General: Bowel sounds are decreased.     Tenderness: There is abdominal tenderness in the epigastric area and periumbilical area. There is guarding. There is no right CVA tenderness, left CVA tenderness or rebound. Positive signs include Murphy's sign.    Skin:    General: Skin is warm and dry.  Neurological:     Mental Status: She is  oriented to person, place, and time.  Psychiatric:        Mood and Affect: Mood normal.      UC Treatments / Results  Labs (all labs ordered are listed, but only abnormal results are displayed) Labs Reviewed  POC URINE PREG, ED  POCT URINALYSIS DIP (DEVICE)    EKG   Radiology No results found.  Procedures Procedures (including critical care time)  Medications Ordered in UC Medications  metoCLOPramide (REGLAN) injection 10 mg (has no administration in time range)  ondansetron (ZOFRAN-ODT) disintegrating tablet 4 mg (4 mg Oral Given 06/18/19 0839)    Initial Impression / Assessment and Plan / UC Course  I have reviewed the triage vital signs and the nursing notes.  Pertinent labs & imaging results that were available during my care of the patient were reviewed by me and considered in my medical decision making (see chart for details).    During exam, patient continued to report gradually worsening abdominal pain with pain now distributing to mid upper back and characterized as sharp. Urine pregnancy negative. UA unremarkable with the exception of dehydration note. Patient will need at minimum CT of abdomen to rule out acute abdomen. Exam finding suspicious for gallbladder involvement given location and quality of pain.  Patient is being transported to ER     Final Clinical Impressions(s) / UC Diagnoses   Final diagnoses:  Abdominal pain, epigastric  Non-intractable vomiting with nausea, unspecified vomiting type   Discharge Instructions   None    ED Prescriptions    None     PDMP not reviewed this encounter.   Bing Neighbors, FNP 06/18/19 1355

## 2019-06-18 NOTE — Transfer of Care (Signed)
Immediate Anesthesia Transfer of Care Note  Patient: Gina Zimmerman  Procedure(s) Performed: APPENDECTOMY LAPAROSCOPIC (N/A Abdomen)  Patient Location: PACU  Anesthesia Type:General  Level of Consciousness: sedated, drowsy, patient cooperative and responds to stimulation  Airway & Oxygen Therapy: Patient Spontanous Breathing and Patient connected to nasal cannula oxygen  Post-op Assessment: Report given to RN, Post -op Vital signs reviewed and stable and Patient moving all extremities X 4  Post vital signs: Reviewed and stable  Last Vitals:  Vitals Value Taken Time  BP 131/77 06/18/19 1957  Temp 36.4 C 06/18/19 1956  Pulse 126 06/18/19 1959  Resp 22 06/18/19 1959  SpO2 99 % 06/18/19 1959  Vitals shown include unvalidated device data.  Last Pain:  Vitals:   06/18/19 1956  TempSrc:   PainSc: Asleep         Complications: No apparent anesthesia complications

## 2019-06-18 NOTE — H&P (Signed)
[facility] Admission Note  Gina Zimmerman 03-17-2001  [id number].    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 States that the pain is epigastric and started last night. Radiates to periumbilical region and is gradually getting worse. Associated multiple episodes of nausea and vomiting.  She denies any diarrhea or fevers.  She has no other associated symptoms. Initially went to urgent care who sent her to the ED for CT abdomen. In the ED blood work remarkable for WBC 15.5, AST 53, ALT 98. RUQ u/s normal. CT scan showed 65mm appendix with small amount of free fluid within the right lower quadrant of the abdomen and subtle soft tissue stranding, no evidence for perforation or abscess. General surgery asked to see.   ROS: ROS All systems reviewed and otherwise negative except for as above  No family history on file.  History reviewed. No pertinent past medical history. thrombocytopenia  History reviewed. No pertinent surgical history.  Social History:  reports that she has never smoked. She has never used smokeless tobacco. She reports current alcohol use. She reports that she does not use drugs.  Allergies:  Allergies  Allergen Reactions  . Latex Swelling    (Not in a hospital admission)   Prior to Admission medications   Not on File    Blood pressure 127/88, pulse (!) 105, temperature 98.1 F (36.7 C), temperature source Oral, resp. rate 16, last menstrual period 05/22/2019, SpO2 100 %. Physical Exam: General: pleasant, WD/WN AA female who is laying in bed in NAD HEENT: head is normocephalic, atraumatic.  Sclera are noninjected.  PERRL.  Ears and nose without any masses or lesions.  Mouth is pink and moist.  Dentition fair Heart: regular rhythm, but slightly tachy.  Normal s1,s2. No obvious murmurs, gallops, or rubs noted.  Palpable pedal pulses bilaterally  Lungs: CTAB, no wheezes, rhonchi, or rales noted.  Respiratory effort nonlabored Abd: soft, mildly tender in RLQ at McBurney's point.  No guarding or rebounding/ND, +BS, no masses, hernias, or organomegaly MS: no BUE/BLE edema, calves soft and nontender Skin: warm and dry with no masses, lesions, or rashes Psych: A&Ox4 with an appropriate affect Neuro: cranial nerves grossly intact, equal strength in BUE/BLE bilaterally, normal speech, though process intact  Results for orders placed or performed during the hospital encounter of 06/18/19 (from the past 48 hour(s))  Lipase, blood     Status: None   Collection Time: 06/18/19 10:04 AM  Result Value Ref Range   Lipase 17 11 - 51 U/L    Comment: Performed at [facility], [address], [place], [place] [address]  Comprehensive metabolic panel     Status: Abnormal   Collection Time: 06/18/19 10:04 AM  Result Value Ref Range   Sodium 138 135 - 145 mmol/L   Potassium 3.7 3.5 - 5.1 mmol/L   Chloride 103 98 - 111 mmol/L   CO2 24 22 - 32 mmol/L   Glucose, Bld 113 (H) 70 - 99 mg/dL   BUN 8 6 - 20 mg/dL   Creatinine, Ser 7.20 0.44 - 1.00 mg/dL   Calcium 9.6 8.9 - 94.7 mg/dL   Total Protein 7.8 6.5 - 8.1 g/dL   Albumin 4.3 3.5 - 5.0 g/dL   AST 53 (H) 15 -  41 U/L   ALT 98 (H) 0 - 44 U/L   Alkaline Phosphatase 122 38 - 126 U/L   Total Bilirubin 0.6 0.3 - 1.2 mg/dL   GFR calc non Af Amer >60 >60 mL/min   GFR calc Af Amer >60 >60 mL/min   Anion gap 11 5 - 15    Comment: Performed at [facility] [address] [place], [place] [address]  CBC     Status: Abnormal   Collection Time: 06/18/19 10:04 AM  Result Value Ref Range   WBC 15.5 (H) 4.0 - 10.5 K/uL   RBC 5.36 (H) 3.87 - 5.11 MIL/uL   Hemoglobin 13.5 12.0 - 15.0 g/dL   HCT 43.4 36.0 - 46.0 %   MCV 81.0 80.0 - 100.0 fL    MCH 25.2 (L) 26.0 - 34.0 pg   MCHC 31.1 30.0 - 36.0 g/dL   RDW 21.0 (H) 11.5 - 15.5 %   Platelets 739 (H) 150 - 400 K/uL   nRBC 0.2 0.0 - 0.2 %    Comment: Performed at [facility], [place] [address] [place], [place] [address]  I-Stat beta hCG blood, ED     Status: None   Collection Time: 06/18/19 10:12 AM  Result Value Ref Range   I-stat hCG, quantitative <5.0 <5 mIU/mL   Comment 3            Comment:   GEST. AGE      CONC.  (mIU/mL)   <=1 WEEK        5 - 50     2 WEEKS       50 - 500     3 WEEKS       100 - 10,000     4 WEEKS     1,000 - 30,000        FEMALE AND NON-PREGNANT FEMALE:     LESS THAN 5 mIU/mL   Urinalysis, Routine w reflex microscopic     Status: Abnormal   Collection Time: 06/18/19 10:37 AM  Result Value Ref Range   Color, Urine YELLOW YELLOW   APPearance TURBID (A) CLEAR   Specific Gravity, Urine 1.021 1.005 - 1.030   pH 5.0 5.0 - 8.0   Glucose, UA NEGATIVE NEGATIVE mg/dL   Hgb urine dipstick NEGATIVE NEGATIVE   Bilirubin Urine NEGATIVE NEGATIVE   Ketones, ur NEGATIVE NEGATIVE mg/dL   Protein, ur NEGATIVE NEGATIVE mg/dL   Nitrite NEGATIVE NEGATIVE   Leukocytes,Ua NEGATIVE NEGATIVE   RBC / HPF 0-5 0 - 5 RBC/hpf   WBC, UA 0-5 0 - 5 WBC/hpf   Bacteria, UA NONE SEEN NONE SEEN   Squamous Epithelial / LPF 0-5 0 - 5   Mucus PRESENT     Comment: Performed at [facility], [place] [address] [place], [place] [address]   CT ABDOMEN PELVIS W CONTRAST  Result Date: 06/18/2019 CLINICAL DATA:  Right upper quadrant abdominal pain EXAM: CT ABDOMEN AND PELVIS WITH CONTRAST TECHNIQUE: Multidetector CT imaging of the abdomen and pelvis was performed using the standard protocol following bolus administration of intravenous contrast. CONTRAST:  128mL OMNIPAQUE IOHEXOL 300 MG/ML  SOLN COMPARISON:  None. FINDINGS: Lower chest: No acute abnormality. Hepatobiliary: No focal liver abnormality. The gallbladder appears normal. No biliary dilatation. Pancreas: Unremarkable. No  pancreatic ductal dilatation or surrounding inflammatory changes. Spleen: Measures 6.8 x 10.6 by 15.4 cm (volume = 580 cm^3). No focal splenic abnormality. Adrenals/Urinary Tract: The adrenal glands appear normal. No  mass or hydronephrosis. Urinary bladder appears normal. Stomach/Bowel: Stomach is within normal limits. The appendix is identified within the right lower quadrant of the abdomen and measures 9 mm in thickness. There is a small amount of free fluid within the right lower quadrant of the abdomen and subtle soft tissue stranding. No evidence for bowel perforation or abscess. Vascular/Lymphatic: No significant vascular findings are present. No enlarged abdominal or pelvic lymph nodes. Reproductive: Uterus unremarkable. Prominent cyst in the right ovary measures 2.8 cm, image 79/3. Other: No focal fluid collections identified. Musculoskeletal: No acute or significant osseous findings. IMPRESSION: 1. Imaging findings concerning for acute appendicitis. No evidence for perforation or abscess. Clinical correlation advised. 2. Splenomegaly. Electronically Signed   By: Signa Kell M.D.   On: 06/18/2019 15:57   US Abdomen Limited  Result Date: 06/18/2019 CLINICAL DATA:  Right upper quadrant abdominal pain. EXAM: ULTRASOUND ABDOMEN LIMITED RIGHT UPPER QUADRANT COMPARISON:  None. FINDINGS: Gallbladder: No gallstones or wall thickening visualized. No sonographic Murphy sign noted by sonographer. Common bile duct: Diameter: 2.8 mm Liver: Normal echogenicity without focal lesion or biliary dilatation. Portal vein is patent on color Doppler imaging with normal direction of blood flow towards the liver. Other: None. IMPRESSION: Normal right upper quadrant ultrasound examination. Electronically Signed   By: Rudie Meyer M.D.   On: 06/18/2019 12:31      Assessment/Plan Acute appendicitis - Patient with clinical and radiographic evidence of acute appendicitis. Will plan for laparoscopic appendectomy tonight  pending covid test. Rocephin/flagyl has been ordered. Keep NPO.  Patient may be able to go home from PACU if all goes well.  Discussed this with the patient.  I have discussed the procedure and risks of appendectomy. The risks include but are not limited to bleeding, infection, wound problems, anesthesia, injury to intra-abdominal organs, possibility of postoperative ileus. She seems to understand and agrees with the plan.    Letha Cape, [facility] 06/18/2019, 4:16 PM Please see Amion for pager number during day hours 7:00am-4:30pm

## 2019-06-18 NOTE — Anesthesia Postprocedure Evaluation (Signed)
Anesthesia Post Note  Patient: Gina Zimmerman  Procedure(s) Performed: APPENDECTOMY LAPAROSCOPIC (N/A Abdomen)     Patient location during evaluation: PACU Anesthesia Type: General Level of consciousness: sedated Pain management: pain level controlled Vital Signs Assessment: post-procedure vital signs reviewed and stable Respiratory status: spontaneous breathing and respiratory function stable Cardiovascular status: stable Postop Assessment: no apparent nausea or vomiting Anesthetic complications: no    Last Vitals:  Vitals:   06/18/19 2030 06/18/19 2045  BP: 126/80 123/83  Pulse: (!) 101 100  Resp: 19 18  Temp:  36.4 C  SpO2: 99% 96%    Last Pain:  Vitals:   06/18/19 1956  TempSrc:   PainSc: Asleep                 [name]

## 2019-06-18 NOTE — Anesthesia Preprocedure Evaluation (Addendum)
Anesthesia Evaluation  Patient identified by MRN, date of birth, ID band Patient awake    Reviewed: Allergy & Precautions, NPO status , Patient's Chart, lab work & pertinent test results  Airway Mallampati: II  TM Distance: >3 FB Neck ROM: Full    Dental no notable dental hx. (+) Dental Advisory Given   Pulmonary neg pulmonary ROS,    Pulmonary exam normal        Cardiovascular negative cardio ROS Normal cardiovascular exam     Neuro/Psych negative neurological ROS  negative psych ROS   GI/Hepatic Neg liver ROS,   Endo/Other  negative endocrine ROS  Renal/GU negative Renal ROS     Musculoskeletal negative musculoskeletal ROS (+)   Abdominal   Peds  Hematology negative hematology ROS (+)   Anesthesia Other Findings   Reproductive/Obstetrics negative OB ROS                            Anesthesia Physical Anesthesia Plan  ASA: II  Anesthesia Plan: General   Post-op Pain Management:    Induction: Intravenous and Rapid sequence  PONV Risk Score and Plan: 4 or greater and Ondansetron, Dexamethasone, Midazolam and Treatment may vary due to age or medical condition  Airway Management Planned: Oral ETT  Additional Equipment: None  Intra-op Plan:   Post-operative Plan: Extubation in OR  Informed Consent: I have reviewed the patients History and Physical, chart, labs and discussed the procedure including the risks, benefits and alternatives for the proposed anesthesia with the patient or authorized representative who has indicated his/her understanding and acceptance.     Dental advisory given  Plan Discussed with: CRNA and Anesthesiologist  Anesthesia Plan Comments:        Anesthesia Quick Evaluation

## 2019-06-18 NOTE — ED Triage Notes (Signed)
C/o mid upper abd/epigastric pain since last night with nausea and vomiting.  Denies diarrhea.

## 2019-06-18 NOTE — OR Nursing (Signed)
Care of patient assumed at 1910. 

## 2019-06-18 NOTE — Op Note (Signed)
Operative Report  Gina Zimmerman 19 y.o. female  [id number]  [id number]  06/18/2019  Surgeon: Berna Bue   Assistant: none  Procedure performed: Laparoscopic Appendectomy  Preop diagnosis: Acute appendicitis  Post-op diagnosis/intraop findings: Acute appendicitis  Specimens: appendix  EBL: minimal  Complications: none  Description of procedure: After obtaining informed consent the patient was brought to the operating room. Antibiotics had been administered. SCD's were applied. General endotracheal anesthesia was initiated and a formal time-out was performed. Foley catheter inserted which is removed at the end of the case. The abdomen was prepped and draped in the usual sterile fashion and the abdomen was entered using an infraumbilical Veress needle and insufflated to 15 mmHg. A 5 mm trocar and camera were then introduced, the abdomen was inspected and there is no evidence of injury from our entry. A suprapubic 5 mm trocar and a left lower quadrant 12 mm trocar were introduced under direct visualization following infiltration with local.  A diagnostic laparoscopy was performed: The liver appears normal and the gallbladder does not appear inflamed.  The stomach is distended with gas but has no external signs of inflammation, there is no inflammation overlying the pylorus or proximal duodenum.  No abnormality in the upper abdomen.  The ascending, transverse, descending and sigmoid colon are grossly normal in appearance as was the rectum.  There is moderate volume of purulent fluid in the pelvis.  The small bowel was run from the ileocecal valve to the ligament of Treitz and there was no abnormality identified.  The patient was then placed in Trendelenburg and rotated to the left and the small bowel was reflected cephalad. The appendix was visualized: It is somewhat dilated and firm with a scant amount of purulent exudate. The appendix was partially retrocecal. A combination of blunt  dissection and harmonic scalpel were used to free it of its retroperitoneal attachments. Great care was taken to ensure no injury to surrounding retroperitoneal structures, cecum or terminal ileum.  The mesoappendix was divided with the harmonic scalpel.  Hemostasis was excellent.  The appendix was then transected from the cecum with a white load linear cutting stapler.  No appendiceal stump was left.  There was no impingement on the terminal ileum from the staple line.  The staple line appears healthy and hemostatic.  The appendix was removed through the 12 mm trocar and will be sent for pathology.  Returning to the pelvis, the purulent fluid was aspirated.  The uterus is normal in size.  The right ovary is enlarged and there is a minimal amount of hemorrhage from what appears to be a cyst, but no purulent exudate or drainage from either ovary or tube although they were slightly secondarily inflamed from the purulent fluid in the pelvis.  The pelvis and right lower quadrant were irrigated and aspirated, the effluent was clear.  The omentum was brought down to cover the staple line of the cecum.  The 26mm trocar site in the left lower quadrant was closed with a 0 vicryl in the fascia under direct visualization using a PMI device. The abdomen was desufflated and all trocars removed. The skin incisions were closed with subcuticular monocryl and Dermabond. The patient was awakened, extubated and transported to the recovery room in stable condition.   All counts were correct at the completion of the case.

## 2019-06-18 NOTE — ED Notes (Signed)
Patient is being discharged from the Urgent Care Center and sent to the Emergency Department via POV and family. Per Jerrilyn Cairo NP patient is stable but in need of higher level of care due to abd pain, n/v. Patient is aware and verbalizes understanding of plan of care.  Vitals:   06/18/19 0829  BP: 114/72  Pulse: (!) 110  Resp: 17  Temp: 99 F (37.2 C)  SpO2: 100%

## 2019-06-18 NOTE — ED Notes (Signed)
Patient transported to US 

## 2019-06-20 LAB — SURGICAL PATHOLOGY

## 2019-07-08 DIAGNOSIS — Z1159 Encounter for screening for other viral diseases: Secondary | ICD-10-CM | POA: Diagnosis not present

## 2019-07-08 DIAGNOSIS — Z20828 Contact with and (suspected) exposure to other viral communicable diseases: Secondary | ICD-10-CM | POA: Diagnosis not present

## 2019-07-19 ENCOUNTER — Ambulatory Visit (INDEPENDENT_AMBULATORY_CARE_PROVIDER_SITE_OTHER): Payer: BC Managed Care – PPO | Admitting: Obstetrics & Gynecology

## 2019-07-19 ENCOUNTER — Encounter: Payer: Self-pay | Admitting: Obstetrics & Gynecology

## 2019-07-19 ENCOUNTER — Other Ambulatory Visit: Payer: Self-pay

## 2019-07-19 ENCOUNTER — Other Ambulatory Visit (HOSPITAL_COMMUNITY)
Admission: RE | Admit: 2019-07-19 | Discharge: 2019-07-19 | Disposition: A | Discharge status: Home / Self Care | Payer: BC Managed Care – PPO | Source: Ambulatory Visit | Attending: Obstetrics & Gynecology | Admitting: Obstetrics & Gynecology

## 2019-07-19 VITALS — BP 120/80 | Ht 63.0 in | Wt 160.0 lb

## 2019-07-19 DIAGNOSIS — N760 Acute vaginitis: Secondary | ICD-10-CM

## 2019-07-19 DIAGNOSIS — R87612 Low grade squamous intraepithelial lesion on cytologic smear of cervix (LGSIL): Secondary | ICD-10-CM | POA: Insufficient documentation

## 2019-07-19 DIAGNOSIS — Z124 Encounter for screening for malignant neoplasm of cervix: Secondary | ICD-10-CM | POA: Diagnosis not present

## 2019-07-19 DIAGNOSIS — B9689 Other specified bacterial agents as the cause of diseases classified elsewhere: Secondary | ICD-10-CM | POA: Diagnosis not present

## 2019-07-19 DIAGNOSIS — N83201 Unspecified ovarian cyst, right side: Secondary | ICD-10-CM

## 2019-07-19 DIAGNOSIS — B373 Candidiasis of vulva and vagina: Secondary | ICD-10-CM | POA: Diagnosis not present

## 2019-07-19 MED ORDER — FLUCONAZOLE 150 MG PO TABS: 150.0000 mg | ORAL_TABLET | Freq: Once | ORAL | 3 refills | Status: AC

## 2019-07-19 NOTE — Patient Instructions (Signed)
Transvaginal Ultrasound A transvaginal ultrasound, also called an endovaginal ultrasound, is a test that uses sound waves to take pictures of the female genital tract. The pictures are taken with a device, called a transducer, that is placed in the vagina. This test may be done to:  Check for problems with your pregnancy.  Check your developing baby.  Check for anything abnormal in the uterus or ovaries.  Find out why you have pelvic pain or bleeding. Tell a health care provider about:  Any allergies you have.  All medicines you are taking, including vitamins, herbs, eye drops, creams, and over-the-counter medicines.  Any blood disorders you have.  Any surgeries you have had.  Any medical conditions you have.  Whether you are pregnant or may be pregnant.  Whether you are having your menstrual period. What are the risks? This is a safe procedure. There are no known risks or complications of having this test. What happens before the procedure? This procedure needs to be done when your bladder is empty. Follow your health care provider's instructions about drinking fluids and emptying your bladder before the test. What happens during the procedure?   You will empty your bladder before the procedure.  You will undress from the waist down.  You will lie down on an exam table, with your knees bent and your feet in foot holders.  A health care provider will cover the transducer with a sterile cover.  A gel will be put on the transducer. The gel helps transmit the sound waves and prevents irritation of your vagina.  The technician will insert the transducer into your vagina to get images. These will be displayed on a monitor that looks like a small television screen.  The transducer will be removed when the procedure is complete. The procedure may vary among health care providers and hospitals. What happens after the procedure?  It is up to you to get the results of your  procedure. Ask your health care provider, or the department that is doing the procedure, when your results will be ready.  Keep all follow-up visits as told by your health care provider. This is important. Summary  A transvaginal ultrasound, also called an endovaginal ultrasound, is a test that uses sound waves to take pictures of the female genital tract.  This is a safe procedure. There are no known risks associated with this test.  The procedure needs to be done when your bladder is empty. Follow your health care provider's instructions about drinking fluids and emptying your bladder before the test.  During the procedure, you will undress from the waist down and lie down on an exam table. A technician will insert a transducer into your vagina to obtain images.  Ask your health care provider, or the department that is doing the procedure, when your results will be ready. This information is not intended to replace advice given to you by your health care provider. Make sure you discuss any questions you have with your health care provider. Document Revised: 12/13/2017 Document Reviewed: 12/13/2017 Elsevier Patient Education  2020 Elsevier Inc.  

## 2019-07-19 NOTE — Progress Notes (Signed)
Consultant: Carlena Hurl, PA-C Reason- Cyst  HPI: Patient is a 19 y.o. with Patient's last menstrual period was 06/30/2019., presents today for a problem visit.  She complains of recent findings of Right ovarion cyst by noted at surgery for appendectomy in February 2021.  It was described as 3 cm and without adhesions or other abnormalities.  CT on 06/18/19 describes a 2.8 cm right ovarian simple cyst.  Pt has had symptoms of none, she had RLQ pain related to appendicitis last month, but that has all resolved.  No bloating.  No weight changes.  Reg periods off of Nexplanon for the last 2 years (uses condoms for [facility])..  Previous evaluation: none. Prior Diagnosis: right ovarian cyst. Previous Treatment: none.  PMHx: She  has no past medical history on file. Also,  has a past surgical history that includes laparoscopic appendectomy (N/A, 06/18/2019)., family history is not on file.,  reports that she has never smoked. She has never used smokeless tobacco. She reports current alcohol use. She reports that she does not use drugs.  She has a current medication list which includes the following prescription(s): ondansetron, tramadol, and fluconazole. Also, is allergic to latex.  Review of Systems  Constitutional: Negative for chills, fever and malaise/fatigue.  HENT: Negative for congestion, sinus pain and sore throat.   Eyes: Negative for blurred vision and pain.  Respiratory: Negative for cough and wheezing.   Cardiovascular: Negative for chest pain and leg swelling.  Gastrointestinal: Negative for abdominal pain, constipation, diarrhea, heartburn, nausea and vomiting.  Genitourinary: Negative for dysuria, frequency, hematuria and urgency.  Musculoskeletal: Negative for back pain, joint pain, myalgias and neck pain.  Skin: Negative for itching and rash.  Neurological: Negative for dizziness, tremors and weakness.  Endo/Heme/Allergies: Does not bruise/bleed easily.  Psychiatric/Behavioral: Negative  for depression. The patient is not nervous/anxious and does not have insomnia.     Objective: BP 120/80   Ht 5\' 3"  (1.6 m)   Wt 160 lb (72.6 kg)   LMP 06/30/2019   BMI 28.34 kg/m  Physical Exam Constitutional:      General: She is not in acute distress.    Appearance: She is well-developed.  Genitourinary:     Pelvic exam was performed with patient supine.     Vagina, uterus and rectum normal.     No lesions in the vagina.     No vaginal bleeding.     No cervical motion tenderness, friability, lesion or polyp.     Uterus is mobile.     Uterus is not enlarged.     No uterine mass detected.    Uterus is midaxial.     No right or left adnexal mass present.     Right adnexa not tender.     Left adnexa not tender.  HENT:     Head: Normocephalic and atraumatic. No laceration.     Right Ear: Hearing normal.     Left Ear: Hearing normal.     Mouth/Throat:     Pharynx: Uvula midline.  Eyes:     Pupils: Pupils are equal, round, and reactive to light.  Neck:     Thyroid: No thyromegaly.  Cardiovascular:     Rate and Rhythm: Normal rate and regular rhythm.     Heart sounds: No murmur. No friction rub. No gallop.   Pulmonary:     Effort: Pulmonary effort is normal. No respiratory distress.     Breath sounds: Normal breath sounds. No wheezing.  Abdominal:  General: Bowel sounds are normal. There is no distension.     Palpations: Abdomen is soft.     Tenderness: There is no abdominal tenderness. There is no rebound.  Musculoskeletal:        General: Normal range of motion.     Cervical back: Normal range of motion and neck supple.  Neurological:     Mental Status: She is alert and oriented to person, place, and time.     Cranial Nerves: No cranial nerve deficit.  Skin:    General: Skin is warm and dry.  Psychiatric:        Judgment: Judgment normal.  Vitals reviewed.   Microscopic wet-mount exam shows hyphae.  ASSESSMENT/PLAN:    Problem List Items Addressed This  Visit      Endocrine   Ovarian cyst, right - Primary    Monitor for symptoms, as is asymptomatic at this time; counseled as to intermittent nature of cysts often w ovarian cysts   Relevant Orders   US PELVIC COMPLETE WITH TRANSVAGINAL, 2 weeks, for follow up    Other Visit Diagnoses    Screening for cervical cancer       Relevant Orders   Cytology - PAP Also STD screen w PAP   Acute vaginitis       Relevant Orders   Cervicovaginal ancillary only    Diflucan for yeast infection She is considering Nexplanon for contraception (has used in past)  Annamarie Major, MD, [facility], [facility] 07/19/2019  10:48 AM

## 2019-07-22 DIAGNOSIS — Z20828 Contact with and (suspected) exposure to other viral communicable diseases: Secondary | ICD-10-CM | POA: Diagnosis not present

## 2019-07-22 DIAGNOSIS — Z1159 Encounter for screening for other viral diseases: Secondary | ICD-10-CM | POA: Diagnosis not present

## 2019-07-23 LAB — CERVICOVAGINAL ANCILLARY ONLY
Bacterial Vaginitis (gardnerella): POSITIVE — AB
Candida Glabrata: NEGATIVE
Candida Vaginitis: POSITIVE — AB
Chlamydia: NEGATIVE
Comment: NEGATIVE
Comment: NEGATIVE
Comment: NEGATIVE
Comment: NEGATIVE
Comment: NORMAL
Neisseria Gonorrhea: NEGATIVE

## 2019-07-24 ENCOUNTER — Other Ambulatory Visit: Payer: Self-pay | Admitting: Obstetrics & Gynecology

## 2019-07-24 ENCOUNTER — Telehealth: Payer: Self-pay | Admitting: Obstetrics & Gynecology

## 2019-07-24 LAB — CYTOLOGY - PAP

## 2019-07-24 MED ORDER — METRONIDAZOLE 500 MG PO TABS: 500.0000 mg | ORAL_TABLET | Freq: Two times a day (BID) | ORAL | 0 refills | Status: AC

## 2019-07-24 NOTE — Telephone Encounter (Signed)
Patient's voicemail box is full unable to update patient on schedule visit change for 08/09/19 to ultrasound and Colpo.

## 2019-07-24 NOTE — Progress Notes (Signed)
Sch Colpo.  MyChart message sent to patient discussing results of PAP

## 2019-07-24 NOTE — Telephone Encounter (Signed)
-----   Message from Nadara Mustard, MD sent at 07/24/2019  7:21 AM EST ----- Sch Colpo.  MyChart message sent to patient discussing results of PAP

## 2019-07-24 NOTE — Telephone Encounter (Signed)
Patient is aware of schedule change.patient would like to speak with Dr. Tiburcio Pea to go over her results to why she needs a Colpo. Patient aware RPH is out of the office today and will return tomorrow

## 2019-07-29 DIAGNOSIS — Z20828 Contact with and (suspected) exposure to other viral communicable diseases: Secondary | ICD-10-CM | POA: Diagnosis not present

## 2019-07-29 DIAGNOSIS — Z1159 Encounter for screening for other viral diseases: Secondary | ICD-10-CM | POA: Diagnosis not present

## 2019-08-05 DIAGNOSIS — Z20828 Contact with and (suspected) exposure to other viral communicable diseases: Secondary | ICD-10-CM | POA: Diagnosis not present

## 2019-08-05 DIAGNOSIS — Z1159 Encounter for screening for other viral diseases: Secondary | ICD-10-CM | POA: Diagnosis not present

## 2019-08-09 ENCOUNTER — Other Ambulatory Visit: Payer: Self-pay

## 2019-08-09 ENCOUNTER — Ambulatory Visit (INDEPENDENT_AMBULATORY_CARE_PROVIDER_SITE_OTHER): Payer: BC Managed Care – PPO | Admitting: Obstetrics & Gynecology

## 2019-08-09 ENCOUNTER — Other Ambulatory Visit: Payer: Self-pay | Admitting: Obstetrics & Gynecology

## 2019-08-09 ENCOUNTER — Ambulatory Visit: Payer: BC Managed Care – PPO | Admitting: Obstetrics & Gynecology

## 2019-08-09 ENCOUNTER — Other Ambulatory Visit (HOSPITAL_COMMUNITY)
Admission: RE | Admit: 2019-08-09 | Discharge: 2019-08-09 | Disposition: A | Discharge status: Home / Self Care | Payer: BC Managed Care – PPO | Source: Ambulatory Visit | Attending: Obstetrics & Gynecology | Admitting: Obstetrics & Gynecology

## 2019-08-09 ENCOUNTER — Ambulatory Visit (INDEPENDENT_AMBULATORY_CARE_PROVIDER_SITE_OTHER): Payer: BC Managed Care – PPO

## 2019-08-09 ENCOUNTER — Encounter: Payer: Self-pay | Admitting: Obstetrics & Gynecology

## 2019-08-09 VITALS — BP 100/70 | Ht 64.0 in | Wt 160.0 lb

## 2019-08-09 DIAGNOSIS — R102 Pelvic and perineal pain: Secondary | ICD-10-CM

## 2019-08-09 DIAGNOSIS — N83201 Unspecified ovarian cyst, right side: Secondary | ICD-10-CM

## 2019-08-09 DIAGNOSIS — R87612 Low grade squamous intraepithelial lesion on cytologic smear of cervix (LGSIL): Secondary | ICD-10-CM | POA: Diagnosis not present

## 2019-08-09 NOTE — Progress Notes (Signed)
HPI:  Gina Zimmerman is a 19 y.o.  No obstetric history on file.  who presents today for evaluation and management of abnormal cervical cytology.    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  OB History  No obstetric history on file.    History reviewed. No pertinent past medical history.  Past Surgical History:  Procedure Laterality Date  . LAPAROSCOPIC APPENDECTOMY N/A 06/18/2019   Procedure: APPENDECTOMY LAPAROSCOPIC;  Surgeon: Clovis Riley, MD;  Location: [facility];  Service: General;  Laterality: N/A;    SOCIAL HISTORY: Social History   Substance and Sexual Activity  Alcohol Use Yes   Social History   Substance and Sexual Activity  Drug Use No     History reviewed. No pertinent family history.  ALLERGIES:  Latex  Current Outpatient Medications on File Prior to Visit  Medication Sig Dispense Refill  . ondansetron (ZOFRAN ODT) 4 MG disintegrating tablet Take 1 tablet (4 mg total) by mouth every 8 (eight) hours as needed for nausea or vomiting. 20 tablet 0  . traMADol (ULTRAM) 50 MG tablet Take 1 tablet (50 mg total) by mouth every 6 (six) hours as needed (Alternate Tylenol and ibuprofen for the first 4 days after surgery.  Take this medication for pain not relieved by Tylenol or ibuprofen.). 20 tablet 0   No current facility-administered medications on file prior to visit.    Physical Exam: -Vitals:  BP 100/70   Ht 5\' 4"  (1.626 m)   Wt 160 lb (72.6 kg)   LMP 07/27/2019   BMI 27.46 kg/m  GEN: WD, WN, NAD.  A+ O x 3, good mood and affect. ABD:  NT, ND.  Soft, no masses.  No hernias noted.   Pelvic:   Vulva: Normal appearance.  No lesions.  Vagina: No lesions or abnormalities noted.  Support: Normal pelvic support.  Urethra No masses tenderness or scarring.  Meatus Normal size without lesions or prolapse.  Cervix: See below.  Anus: Normal exam.  No lesions.  Perineum: Normal exam.  No lesions.        Bimanual    Uterus: Normal size.  Non-tender.  Mobile.  AV.  Adnexae: No masses.  Non-tender to palpation.  Cul-de-sac: Negative for abnormality.   PROCEDURE: 1.  Urine Pregnancy Test:  not done 2.  Colposcopy performed with 4% acetic acid after verbal consent obtained                                         -Aceto-white Lesions Location(s): none.              -Biopsy performed at 6, 12 o'clock               -ECC indicated and performed: Yes.       -Biopsy sites made hemostatic with pressure, AgNO3, and/or Monsel's solution   -Satisfactory colposcopy: Yes.      -Evidence of Invasive cervical CA :  NO  ASSESSMENT:  Gina Zimmerman is a 19 y.o. No obstetric history on file. here for  1. LGSIL on Pap smear of cervix   2. Ovarian cyst, right   3. Pelvic pain   .  PLAN: 1.  I discussed the grading system of pap smears and HPV high risk viral types.  We will discuss and base management after colpo results  return. 2. Follow up PAP 6 months, vs intervention if high grade dysplasia identified     Annamarie Major, MD, [facility], [facility] 08/09/2019  10:28 AM

## 2019-08-09 NOTE — Patient Instructions (Signed)

## 2019-08-09 NOTE — Progress Notes (Signed)
  HPI: Pt is a 19 yo G0 with small ovarian cyst found by CT when having appenditis.  Feels she did not have pain from cyst.  Since recovery from appendicitis, no pain.  She did feel left sided ovulatory pain for one day.  Denies bloating, weight changes.  Ultrasound demonstrates no masses seen, no cyst (resolved) These findings are Pelvis normal  PMHx: She  has no past medical history on file. Also,  has a past surgical history that includes laparoscopic appendectomy (N/A, 06/18/2019)., family history is not on file.,  reports that she has never smoked. She has never used smokeless tobacco. She reports current alcohol use. She reports that she does not use drugs.  She has a current medication list which includes the following prescription(s): ondansetron and tramadol. Also, is allergic to latex.  Review of Systems  All other systems reviewed and are negative.   Objective: BP 100/70   Ht 5\' 4"  (1.626 m)   Wt 160 lb (72.6 kg)   LMP 07/27/2019   BMI 27.46 kg/m   Physical examination Constitutional NAD, Conversant  Skin No rashes, lesions or ulceration.   Extremities: Moves all appropriately.  Normal ROM for age. No lymphadenopathy.  Neuro: Grossly intact  Psych: Oriented to PPT.  Normal mood. Normal affect.   07/29/2019 PELVIS TRANSVAGINAL NON-OB (TV ONLY)  Result Date: 08/09/2019 Patient Name: Gina Zimmerman DOB: August 06, 2000 MRN: 2000 ULTRASOUND REPORT Location: [facility] Date of Service: 08/09/2019 Indications:Pelvic Pain Findings: The uterus is anteverted and measures 6.5 x 4.6 x 3.3 cm. Echo texture is homogenous without evidence of focal masses. The Endometrium measures 9.0 mm. Right Ovary measures 3.1 x 3.7 x 2.3 cm. It is normal in appearance. Left Ovary measures 4.2 x 2.1 x 2.7 cm. It is normal in appearance. Survey of the adnexa demonstrates no adnexal masses. There is no free fluid in the cul de sac. Impression: 1. Normal pelvic ultrasound. Recommendations: 1.Clinical  correlation with the patient's History and Physical Exam. 08/11/2019, RT Review of ULTRASOUND.    I have personally reviewed images and report of recent ultrasound done at [facility].    Plan of management to be discussed with patient. [facility], MD, FACOG [facility], [facility] 08/09/2019  10:09 AM   Assessment:  LGSIL on Pap smear of cervix  Ovarian cyst, right  Pelvic pain  Resolved, monitor for s/sx recurrence of ovarian cysts  Contraception also discussed.  Options for Nexplanon, Pill, Depo, IUD discussed.  Pt to decide and let 08/11/2019 know.  Info gv.  A total of 20 minutes were spent face-to-face with the patient as well as preparation, review, communication, and documentation during this encounter.   [place], MD, [facility], [facility] 08/09/2019  10:29 AM

## 2019-08-12 DIAGNOSIS — Z20828 Contact with and (suspected) exposure to other viral communicable diseases: Secondary | ICD-10-CM | POA: Diagnosis not present

## 2019-08-12 DIAGNOSIS — Z1159 Encounter for screening for other viral diseases: Secondary | ICD-10-CM | POA: Diagnosis not present

## 2019-08-12 LAB — SURGICAL PATHOLOGY

## 2019-08-19 DIAGNOSIS — Z20828 Contact with and (suspected) exposure to other viral communicable diseases: Secondary | ICD-10-CM | POA: Diagnosis not present

## 2019-08-19 DIAGNOSIS — Z1159 Encounter for screening for other viral diseases: Secondary | ICD-10-CM | POA: Diagnosis not present

## 2019-08-26 DIAGNOSIS — Z1159 Encounter for screening for other viral diseases: Secondary | ICD-10-CM | POA: Diagnosis not present

## 2019-08-26 DIAGNOSIS — Z20828 Contact with and (suspected) exposure to other viral communicable diseases: Secondary | ICD-10-CM | POA: Diagnosis not present

## 2019-09-09 DIAGNOSIS — Z1159 Encounter for screening for other viral diseases: Secondary | ICD-10-CM | POA: Diagnosis not present

## 2019-09-09 DIAGNOSIS — Z20828 Contact with and (suspected) exposure to other viral communicable diseases: Secondary | ICD-10-CM | POA: Diagnosis not present

## 2019-10-28 ENCOUNTER — Other Ambulatory Visit: Payer: Self-pay | Admitting: Obstetrics & Gynecology

## 2019-10-28 MED ORDER — METRONIDAZOLE 0.75 % VA GEL: 1.0000 | Freq: Every day | VAGINAL | 0 refills | Status: AC

## 2020-01-15 ENCOUNTER — Ambulatory Visit (HOSPITAL_COMMUNITY)
Admission: EM | Admit: 2020-01-15 | Discharge: 2020-01-15 | Disposition: A | Discharge status: Home / Self Care | Payer: HRSA Program | Attending: Urgent Care | Admitting: Urgent Care

## 2020-01-15 ENCOUNTER — Encounter (HOSPITAL_COMMUNITY): Payer: Self-pay

## 2020-01-15 ENCOUNTER — Other Ambulatory Visit: Payer: Self-pay

## 2020-01-15 DIAGNOSIS — U071 COVID-19: Secondary | ICD-10-CM | POA: Diagnosis present

## 2020-01-15 DIAGNOSIS — J069 Acute upper respiratory infection, unspecified: Secondary | ICD-10-CM

## 2020-01-15 DIAGNOSIS — R519 Headache, unspecified: Secondary | ICD-10-CM

## 2020-01-15 DIAGNOSIS — Z79899 Other long term (current) drug therapy: Secondary | ICD-10-CM | POA: Diagnosis not present

## 2020-01-15 MED ORDER — BENZONATATE 100 MG PO CAPS: 100.0000 mg | ORAL_CAPSULE | Freq: Three times a day (TID) | ORAL | 0 refills | Status: DC | PRN

## 2020-01-15 MED ORDER — PROMETHAZINE-DM 6.25-15 MG/5ML PO SYRP: 5.0000 mL | ORAL_SOLUTION | Freq: Every evening | ORAL | 0 refills | Status: DC | PRN

## 2020-01-15 MED ORDER — PSEUDOEPHEDRINE HCL 30 MG PO TABS: 30.0000 mg | ORAL_TABLET | Freq: Three times a day (TID) | ORAL | 0 refills | Status: DC | PRN

## 2020-01-15 NOTE — Discharge Instructions (Signed)

## 2020-01-15 NOTE — ED Triage Notes (Signed)
Pt presents with non productive cough and headache X 2 days. °

## 2020-01-15 NOTE — ED Provider Notes (Signed)
[facility]   MRN: [id number] DOB: [birth year]  Subjective:   Gina Zimmerman is a 19 y.o. female presenting for 2-day history of frontal headache, dry cough.  Patient has not had COVID vaccination.  No current facility-administered medications for this encounter.  Current Outpatient Medications:  .  ondansetron (ZOFRAN ODT) 4 MG disintegrating tablet, Take 1 tablet (4 mg total) by mouth every 8 (eight) hours as needed for nausea or vomiting., Disp: 20 tablet, Rfl: 0 .  traMADol (ULTRAM) 50 MG tablet, Take 1 tablet (50 mg total) by mouth every 6 (six) hours as needed (Alternate Tylenol and ibuprofen for the first 4 days after surgery.  Take this medication for pain not relieved by Tylenol or ibuprofen.)., Disp: 20 tablet, Rfl: 0   Allergies  Allergen Reactions  . Latex Swelling    History reviewed. No pertinent past medical history.   Past Surgical History:  Procedure Laterality Date  . LAPAROSCOPIC APPENDECTOMY N/A 06/18/2019   Procedure: APPENDECTOMY LAPAROSCOPIC;  Surgeon: Berna Bue, MD;  Location: [facility] OR;  Service: General;  Laterality: N/A;    History reviewed. No pertinent family history.  Social History   Tobacco Use  . Smoking status: Never Smoker  . Smokeless tobacco: Never Used  Substance Use Topics  . Alcohol use: Yes  . Drug use: No    ROS   Objective:   Vitals: BP 122/86 (BP Location: Right Arm)   Pulse (!) 107   Temp 99 F (37.2 C) (Oral)   Resp 18   LMP 12/28/2019   SpO2 100%   Physical Exam Constitutional:      General: She is not in acute distress.    Appearance: Normal appearance. She is well-developed. She is not ill-appearing, toxic-appearing or diaphoretic.  HENT:     Head: Normocephalic and atraumatic.     Nose: Nose normal.     Mouth/Throat:     Mouth: Mucous membranes are moist.  Eyes:     Extraocular Movements: Extraocular movements intact.     Pupils: Pupils are equal, round, and reactive to light.    Cardiovascular:     Rate and Rhythm: Normal rate and regular rhythm.     Pulses: Normal pulses.     Heart sounds: Normal heart sounds. No murmur heard.  No friction rub. No gallop.   Pulmonary:     Effort: Pulmonary effort is normal. No respiratory distress.     Breath sounds: Normal breath sounds. No stridor. No wheezing, rhonchi or rales.  Skin:    General: Skin is warm and dry.     Findings: No rash.  Neurological:     Mental Status: She is alert and oriented to person, place, and time.     Cranial Nerves: No cranial nerve deficit.     Motor: No weakness.     Coordination: Coordination normal.     Gait: Gait normal.     Deep Tendon Reflexes: Reflexes normal.  Psychiatric:        Mood and Affect: Mood normal. Mood is not anxious or depressed.        Speech: Speech normal.        Behavior: Behavior normal. Behavior is not agitated.        Thought Content: Thought content normal.        Cognition and Memory: Cognition is not impaired. Memory is not impaired.     Assessment and Plan :   PDMP not reviewed this encounter.  1. Viral URI with  cough   2. Frontal headache     Will manage for viral illness such as viral URI, viral syndrome, viral rhinitis, COVID-19. Counseled patient on nature of COVID-19 including modes of transmission, diagnostic testing, management and supportive care.  Offered scripts for symptomatic relief. COVID 19 testing is pending. Counseled patient on potential for adverse effects with medications prescribed/recommended today, ER and return-to-clinic precautions discussed, patient verbalized understanding.     Wallis Bamberg, PA-C 01/15/20 1443

## 2020-01-16 LAB — SARS CORONAVIRUS 2 (TAT 6-24 HRS): SARS Coronavirus 2: POSITIVE — AB

## 2020-01-17 ENCOUNTER — Other Ambulatory Visit: Payer: Self-pay

## 2020-02-10 ENCOUNTER — Ambulatory Visit: Payer: BC Managed Care – PPO | Admitting: Obstetrics & Gynecology

## 2020-02-17 ENCOUNTER — Other Ambulatory Visit: Payer: Self-pay

## 2020-02-17 ENCOUNTER — Ambulatory Visit: Payer: Self-pay

## 2020-02-17 DIAGNOSIS — N3000 Acute cystitis without hematuria: Secondary | ICD-10-CM

## 2020-02-17 NOTE — Progress Notes (Deleted)
Patient, No Pcp Per   No chief complaint on file.   HPI:      Ms. Gina Zimmerman is a 19 y.o. No obstetric history on file. whose LMP was No LMP recorded., presents today for *** UA done yesterday, sent for C&S Hx of BV and yeast on 3/21 culture  No past medical history on file.  Past Surgical History:  Procedure Laterality Date  . LAPAROSCOPIC APPENDECTOMY N/A 06/18/2019   Procedure: APPENDECTOMY LAPAROSCOPIC;  Surgeon: Berna Bue, MD;  Location: [facility] OR;  Service: General;  Laterality: N/A;    No family history on file.  Social History   Socioeconomic History  . Marital status: Single    Spouse name: Not on file  . Number of children: Not on file  . Years of education: Not on file  . Highest education level: Not on file  Occupational History  . Not on file  Tobacco Use  . Smoking status: Never Smoker  . Smokeless tobacco: Never Used  Substance and Sexual Activity  . Alcohol use: Yes  . Drug use: No  . Sexual activity: Yes    Birth control/protection: Implant  Other Topics Concern  . Not on file  Social History Narrative  . Not on file   Social Determinants of Health   Financial Resource Strain:   . Difficulty of Paying Living Expenses: Not on file  Food Insecurity:   . Worried About Programme researcher, broadcasting/film/video in the Last Year: Not on file  . Ran Out of Food in the Last Year: Not on file  Transportation Needs:   . Lack of Transportation (Medical): Not on file  . Lack of Transportation (Non-Medical): Not on file  Physical Activity:   . Days of Exercise per Week: Not on file  . Minutes of Exercise per Session: Not on file  Stress:   . Feeling of Stress : Not on file  Social Connections:   . Frequency of Communication with Friends and Family: Not on file  . Frequency of Social Gatherings with Friends and Family: Not on file  . Attends Religious Services: Not on file  . Active Member of Clubs or Organizations: Not on file  . Attends Tax inspector Meetings: Not on file  . Marital Status: Not on file  Intimate Partner Violence:   . Fear of Current or Ex-Partner: Not on file  . Emotionally Abused: Not on file  . Physically Abused: Not on file  . Sexually Abused: Not on file    Outpatient Medications Prior to Visit  Medication Sig Dispense Refill  . benzonatate (TESSALON) 100 MG capsule Take 1-2 capsules (100-200 mg total) by mouth 3 (three) times daily as needed. 60 capsule 0  . ondansetron (ZOFRAN ODT) 4 MG disintegrating tablet Take 1 tablet (4 mg total) by mouth every 8 (eight) hours as needed for nausea or vomiting. 20 tablet 0  . promethazine-dextromethorphan (PROMETHAZINE-DM) 6.25-15 MG/5ML syrup Take 5 mLs by mouth at bedtime as needed for cough. 100 mL 0  . pseudoephedrine (SUDAFED) 30 MG tablet Take 1 tablet (30 mg total) by mouth every 8 (eight) hours as needed for congestion. 30 tablet 0  . traMADol (ULTRAM) 50 MG tablet Take 1 tablet (50 mg total) by mouth every 6 (six) hours as needed (Alternate Tylenol and ibuprofen for the first 4 days after surgery.  Take this medication for pain not relieved by Tylenol or ibuprofen.). 20 tablet 0   No facility-administered medications prior  to visit.      ROS:  Review of Systems BREAST: No symptoms   OBJECTIVE:   Vitals:  There were no vitals taken for this visit.  Physical Exam  Results: No results found for this or any previous visit (from the past 24 hour(s)).   Assessment/Plan: No diagnosis found.    No orders of the defined types were placed in this encounter.     No follow-ups on file.  Lenni Reckner B. Jaegar Croft, PA-C 02/17/2020 3:43 PM

## 2020-02-18 ENCOUNTER — Ambulatory Visit: Payer: Self-pay | Admitting: Obstetrics and Gynecology

## 2020-02-19 LAB — URINE CULTURE

## 2020-03-20 ENCOUNTER — Ambulatory Visit: Payer: Self-pay | Admitting: Obstetrics & Gynecology

## 2020-03-30 NOTE — Telephone Encounter (Signed)
Any openings?

## 2020-04-08 ENCOUNTER — Encounter: Payer: Self-pay | Admitting: Obstetrics & Gynecology

## 2020-04-08 ENCOUNTER — Ambulatory Visit (INDEPENDENT_AMBULATORY_CARE_PROVIDER_SITE_OTHER): Payer: Self-pay | Admitting: Obstetrics & Gynecology

## 2020-04-08 ENCOUNTER — Other Ambulatory Visit: Payer: Self-pay

## 2020-04-08 ENCOUNTER — Other Ambulatory Visit (HOSPITAL_COMMUNITY)
Admission: RE | Admit: 2020-04-08 | Discharge: 2020-04-08 | Disposition: A | Discharge status: Home / Self Care | Payer: Self-pay | Source: Ambulatory Visit | Attending: Obstetrics & Gynecology | Admitting: Obstetrics & Gynecology

## 2020-04-08 VITALS — BP 110/62 | Ht 64.0 in | Wt 153.0 lb

## 2020-04-08 DIAGNOSIS — R87612 Low grade squamous intraepithelial lesion on cytologic smear of cervix (LGSIL): Secondary | ICD-10-CM

## 2020-04-08 NOTE — Progress Notes (Signed)
HPI:  Patient is a 19 y.o. No obstetric history on file. presenting for follow up evaluation of abnormal PAP smear in the past.  Her last PAP was 8 months ago and was abnormal: LGSIL. She has had a prior colposcopy. Prior biopsies (if done) were Normal.  PMHx: She  has no past medical history on file. Also,  has a past surgical history that includes laparoscopic appendectomy (N/A, 06/18/2019)., family history is not on file.,  reports that she has never smoked. She has never used smokeless tobacco. She reports current alcohol use. She reports that she does not use drugs.  She has a current medication list which includes the following prescription(s): benzonatate, ondansetron, promethazine-dextromethorphan, pseudoephedrine, and tramadol. Also, is allergic to latex.  Review of Systems  All other systems reviewed and are negative.   Objective: BP 110/62   Ht 5\' 4"  (1.626 m)   Wt 153 lb (69.4 kg)   LMP 03/19/2020   BMI 26.26 kg/m  Filed Weights   04/08/20 1112  Weight: 153 lb (69.4 kg)   Body mass index is 26.26 kg/m.  Physical examination Physical Exam Constitutional:      General: She is not in acute distress.    Appearance: She is well-developed.  Genitourinary:     Pelvic exam was performed with patient supine.     Vagina and uterus normal.     No vaginal erythema or bleeding.     No cervical motion tenderness, discharge, polyp or nabothian cyst.     Uterus is mobile.     Uterus is not enlarged.     No uterine mass detected.    Uterus is midaxial.     No right or left adnexal mass present.     Right adnexa not tender.     Left adnexa not tender.  HENT:     Head: Normocephalic and atraumatic.     Nose: Nose normal.  Abdominal:     General: There is no distension.     Palpations: Abdomen is soft.     Tenderness: There is no abdominal tenderness.  Musculoskeletal:        General: Normal range of motion.  Neurological:     Mental Status: She is alert and oriented to  person, place, and time.     Cranial Nerves: No cranial nerve deficit.  Skin:    General: Skin is warm and dry.  Psychiatric:        Attention and Perception: Attention normal.        Mood and Affect: Mood and affect normal.        Speech: Speech normal.        Behavior: Behavior normal.        Thought Content: Thought content normal.        Judgment: Judgment normal.     ASSESSMENT:  History of Cervical Dysplasia  Plan:  1.  I discussed the grading system of pap smears and HPV high risk viral types.   2. Follow up PAP 6 months, vs intervention if high grade dysplasia identified. 3. Treatment of persistantly abnormal PAP smears and cervical dysplasia, even mild, is discussed w pt today in detail, as well as the pros and cons of Cryo and LEEP procedures. Will consider and discuss after results.  A total of 20 minutes were spent face-to-face with the patient as well as preparation, review, communication, and documentation during this encounter.    04/10/20, MD, Annamarie Major Ob/Gyn, [facility] 04/08/2020  11:56 AM

## 2020-04-08 NOTE — Patient Instructions (Signed)
Thank you for choosing [facility]. As part of our ongoing efforts to improve patient experience, we would appreciate your feedback. Please fill out the short survey that you will receive by mail or MyChart. Your opinion is important to us! -Dr Azalia Neuberger  

## 2020-04-15 LAB — CYTOLOGY - PAP: Diagnosis: NEGATIVE

## 2020-07-12 IMAGING — DX ABDOMEN - 2 VIEW
1 series · 1 of 1 positions shown · non-contrast
Comparison: None.

CLINICAL DATA: Stomach pain for a week.

EXAM:
ABDOMEN - 2 VIEW

[abdomen erect]
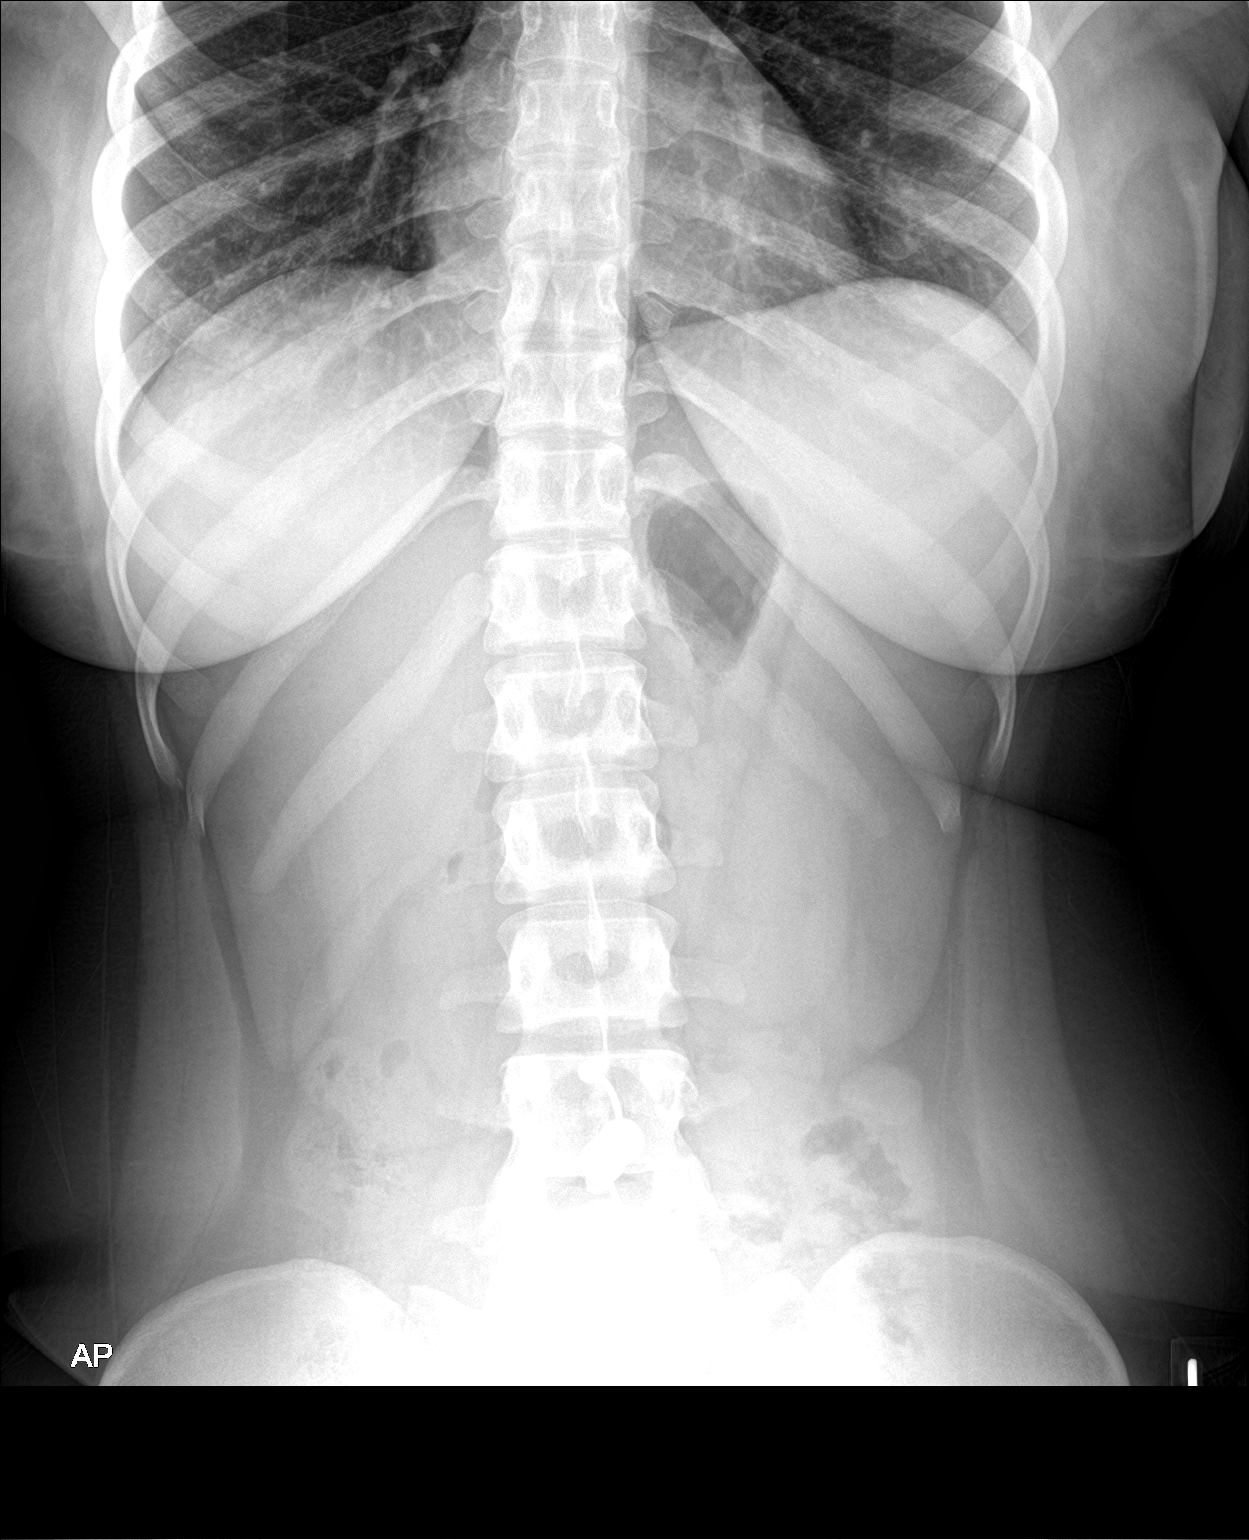

[1 of 1 positions shown; findings below may reference images not displayed]

FINDINGS: The bowel gas pattern is normal. There is no evidence of free air.
No radio-opaque calculi or other significant radiographic
abnormality is seen.
IMPRESSION: Negative.

## 2020-10-09 ENCOUNTER — Ambulatory Visit: Payer: Self-pay | Admitting: Obstetrics & Gynecology

## 2020-12-29 ENCOUNTER — Ambulatory Visit (INDEPENDENT_AMBULATORY_CARE_PROVIDER_SITE_OTHER): Payer: Self-pay | Admitting: Obstetrics & Gynecology

## 2020-12-29 ENCOUNTER — Other Ambulatory Visit: Payer: Self-pay

## 2020-12-29 ENCOUNTER — Encounter: Payer: Self-pay | Admitting: Obstetrics & Gynecology

## 2020-12-29 VITALS — BP 120/80 | Ht 64.0 in | Wt 168.0 lb

## 2020-12-29 DIAGNOSIS — Z30011 Encounter for initial prescription of contraceptive pills: Secondary | ICD-10-CM

## 2020-12-29 DIAGNOSIS — R87612 Low grade squamous intraepithelial lesion on cytologic smear of cervix (LGSIL): Secondary | ICD-10-CM

## 2020-12-29 MED ORDER — NORETHIN ACE-ETH ESTRAD-FE 1-20 MG-MCG(24) PO TABS: 1.0000 | ORAL_TABLET | Freq: Every day | ORAL | 11 refills | Status: AC

## 2020-12-29 NOTE — Patient Instructions (Signed)
Norethindrone Acetate; Ethinyl Estradiol Oral Tablets (contraception) What is this medication? NORETHINDRONE ACETATE; ETHINYL ESTRADIOL (nor eth IN drone AS e tate; ETH in il es tra DYE ole) is an oral contraceptive. The products combine two types of female hormones, an estrogen and a progestin. These products prevent ovulationand pregnancy. This medicine may be used for other purposes; ask your health care provider orpharmacist if you have questions. COMMON BRAND NAME(S): Ala Bent, Hailey 1.5/30, Junel 1.5/30, Junel 1/20, LARIN, Loestrin 1.5/30, Loestrin 1/20, Microgestin 1.5/30, Microgestin1/20 What should I tell my care team before I take this medication? They need to know if you have or ever had any of these conditions: abnormal vaginal bleeding blood vessel disease or blood clots breast, cervical, endometrial, ovarian, liver, or uterine cancer diabetes gallbladder disease having surgery heart disease or recent heart attack high blood pressure high cholesterol or triglycerides history of irregular heartbeat or heart valve problems kidney disease liver disease migraine headaches protein C deficiency protein S deficiency recently had a baby, miscarriage, or abortion stroke systemic lupus erythematosus (SLE) tobacco smoker an unusual or allergic reaction to estrogens, progestins, other medicines, foods, dyes, or preservatives pregnant or trying to get pregnant breast-feeding How should I use this medication? Take this medicine by mouth. To reduce nausea, this medicine may be taken with food. Follow the directions on the prescription label. Take this medicine at the same time each day and in the order directed on the package. Do not takeyour medicine more often than directed. Contact your pediatrician regarding the use of this medicine in children. Special care may be needed. This medicine has been used in female children whohave started having menstrual periods. A patient  package insert for the product will be given with each prescription and refill. Read this sheet carefully each time. The sheet may changefrequently. Overdosage: If you think you have taken too much of this medicine contact apoison control center or emergency room at once. NOTE: This medicine is only for you. Do not share this medicine with others. What if I miss a dose? If you miss a dose, refer to the patient information sheet you received with your medicine for direction. If you miss more than one pill, this medicine maynot be as effective and you may need to use another form of birth control. What may interact with this medication? Do not take this medicine with the following medication: dasabuvir; ombitasvir; paritaprevir; ritonavir ombitasvir; paritaprevir; ritonavir This medicine may also interact with the following medications: acetaminophen antibiotics or medicines for infections, especially rifampin, rifabutin, rifapentine, and griseofulvin, and possibly penicillins or tetracyclines aprepitant ascorbic acid (vitamin C) atorvastatin barbiturate medicines, such as phenobarbital bosentan carbamazepine caffeine clofibrate cyclosporine dantrolene doxercalciferol felbamate grapefruit juice hydrocortisone medicines for anxiety or sleeping problems, such as diazepam or temazepam medicines for diabetes, including pioglitazone mineral oil modafinil mycophenolate nefazodone oxcarbazepine phenytoin prednisolone ritonavir or other medicines for HIV infection or AIDS rosuvastatin selegiline soy isoflavones supplements St. John's wort tamoxifen or raloxifene theophylline thyroid hormones topiramate warfarin This list may not describe all possible interactions. Give your health care provider a list of all the medicines, herbs, non-prescription drugs, or dietary supplements you use. Also tell them if you smoke, drink alcohol, or use illegaldrugs. Some items may interact with your  medicine. What should I watch for while using this medication? Visit your doctor or health care professional for regular checks on your progress. You will need a regular breast and pelvic exam and Pap smear while onthis medicine. Use an additional  method of contraception during the first cycle that you takethese tablets. If you have any reason to think you are pregnant, stop taking this medicineright away and contact your doctor or health care professional. If you are taking this medicine for hormone related problems, it may takeseveral cycles of use to see improvement in your condition. Smoking increases the risk of getting a blood clot or having a stroke while you are taking birth control pills, especially if you are more than 23 years old.You are strongly advised not to smoke. This medicine can make your body retain fluid, making your fingers, hands, or ankles swell. Your blood pressure can go up. Contact your doctor or health careprofessional if you feel you are retaining fluid. This medicine can make you more sensitive to the sun. Keep out of the sun. If you cannot avoid being in the sun, wear protective clothing and use sunscreen.Do not use sun lamps or tanning beds/booths. If you wear contact lenses and notice visual changes, or if the lenses begin tofeel uncomfortable, consult your eye care specialist. In some women, tenderness, swelling, or minor bleeding of the gums may occur. Notify your dentist if this happens. Brushing and flossing your teeth regularly may help limit this. See your dentist regularly and inform your dentist of themedicines you are taking. If you are going to have elective surgery, you may need to stop taking thismedicine before the surgery. Consult your health care professional for advice. This medicine does not protect you against HIV infection (AIDS) or any othersexually transmitted diseases. What side effects may I notice from receiving this medication? Side effects that  you should report to your doctor or health care professionalas soon as possible: allergic reactions such as skin rash or itching, hives, swelling of the lips, mouth, tongue, or throat breast tissue changes or discharge dark patches of skin on your forehead, cheeks, upper lip, and chin depression high blood pressure migraines or severe, sudden headaches signs and symptoms of a blood clot such as breathing problems; changes in vision; chest pain; severe, sudden headache; pain, swelling, warmth in the leg; trouble speaking; sudden numbness or weakness of the face, arm or leg stomach pain symptoms of vaginal infection like itching, irritation or unusual discharge yellowing of the eyes or skin Side effects that usually do not require medical attention (report these toyour doctor or health care professional if they continue or are bothersome): acne breast pain, tenderness irregular vaginal bleeding or spotting, particularly during the first month of use mild headache nausea weight gain (slight) This list may not describe all possible side effects. Call your doctor for medical advice about side effects. You may report side effects to FDA at1-800-FDA-1088. Where should I keep my medication? Keep out of the reach of children. Store at room temperature between 15 and 30 degrees C (59 and 86 degrees F).Throw away any unused medicine after the expiration date. NOTE: This sheet is a summary. It may not cover all possible information. If you have questions about this medicine, talk to your doctor, pharmacist, orhealth care provider.  2022 Elsevier/Gold Standard (2019-03-21 18:13:00)

## 2020-12-29 NOTE — Progress Notes (Signed)
HPI:  Patient is a 20 y.o. G0 presenting for follow up evaluation of abnormal PAP smear in the past.  Her last PAP was 8 months ago and was normal and Prior PAP was LGSIL . She has had a prior colposcopy. Prior biopsies (if done) were Normal.  No birth control, and reports concerns w cost as has no ins  PMHx: She  has no past medical history on file. Also,  has a past surgical history that includes laparoscopic appendectomy (N/A, 06/18/2019)., family history is not on file.,  reports that she has never smoked. She has never used smokeless tobacco. She reports current alcohol use. She reports that she does not use drugs.  She currently has no medications in their medication list. Also, is allergic to latex.  Review of Systems  Constitutional:  Negative for chills, fever and malaise/fatigue.  HENT:  Negative for congestion, sinus pain and sore throat.   Eyes:  Negative for blurred vision and pain.  Respiratory:  Negative for cough and wheezing.   Cardiovascular:  Negative for chest pain and leg swelling.  Gastrointestinal:  Negative for abdominal pain, constipation, diarrhea, heartburn, nausea and vomiting.  Genitourinary:  Negative for dysuria, frequency, hematuria and urgency.  Musculoskeletal:  Negative for back pain, joint pain, myalgias and neck pain.  Skin:  Negative for itching and rash.  Neurological:  Negative for dizziness, tremors and weakness.  Endo/Heme/Allergies:  Does not bruise/bleed easily.  Psychiatric/Behavioral:  Negative for depression. The patient is not nervous/anxious and does not have insomnia.    Objective: BP 120/80   Ht 5\' 4"  (1.626 m)   Wt 168 lb (76.2 kg)   LMP 12/18/2020   BMI 28.84 kg/m  Filed Weights   12/29/20 1457  Weight: 168 lb (76.2 kg)   Body mass index is 28.84 kg/m.  Physical examination Physical Exam Constitutional:      General: She is not in acute distress.    Appearance: She is well-developed.  Genitourinary:     Right Labia: No  rash or tenderness.    Left Labia: No tenderness or rash.    No vaginal erythema or bleeding.      Right Adnexa: not tender and no mass present.    Left Adnexa: not tender and no mass present.    No cervical motion tenderness, discharge, polyp or nabothian cyst.     Uterus is not enlarged.     No uterine mass detected.    Pelvic exam was performed with patient in the lithotomy position.  HENT:     Head: Normocephalic and atraumatic.     Nose: Nose normal.  Abdominal:     General: There is no distension.     Palpations: Abdomen is soft.     Tenderness: There is no abdominal tenderness.  Musculoskeletal:        General: Normal range of motion.  Neurological:     Mental Status: She is alert and oriented to person, place, and time.     Cranial Nerves: No cranial nerve deficit.  Skin:    General: Skin is warm and dry.  Psychiatric:        Attention and Perception: Attention normal.        Mood and Affect: Mood and affect normal.        Speech: Speech normal.        Behavior: Behavior normal.        Thought Content: Thought content normal.        Judgment: Judgment  normal.    ASSESSMENT:  History of Cervical Dysplasia  Plan:  1.  I discussed the grading system of pap smears and HPV high risk viral types.   2. Follow up PAP 6 months, vs intervention if high grade dysplasia identified. 3. OCPs The risks /benefits of OCPs have been explained to the patient in detail.  Product literature has been given to her.  I have instructed her in the use of OCPs and have given her literature reinforcing this information.  I have explained to the patient that OCPs are not as effective for birth control during the first month of use, and that another form of contraception should be used during this time.  Both first-day start and Sunday start have been explained.  The risks and benefits of each was discussed.  She has been made aware of  the fact that other medications may affect the efficacy of  OCPs.  I have answered all of her questions, and I believe that she has an understanding of the effectiveness and use of OCPs. Samples given Lo LoEstrin, encouraged to get ins coverage and to prevent pregnancy until she is ready for a pregnancy.  A total of 20 minutes were spent face-to-face with the patient as well as preparation, review, communication, and documentation during this encounter.    Annamarie Major, MD, [facility] Frederick Ob/G[facility], [facility] 12/29/2020  3:27 PM

## 2021-01-13 ENCOUNTER — Other Ambulatory Visit: Payer: Self-pay | Admitting: Obstetrics & Gynecology

## 2021-02-14 IMAGING — US US ABDOMEN LIMITED
1 series · 14 of 25 positions shown · non-contrast
Comparison: None.

CLINICAL DATA: Right upper quadrant abdominal pain.

EXAM:
ULTRASOUND ABDOMEN LIMITED RIGHT UPPER QUADRANT

[Series 1: us abdomen limited · 14 of 41 slices shown]
[im 1/41]
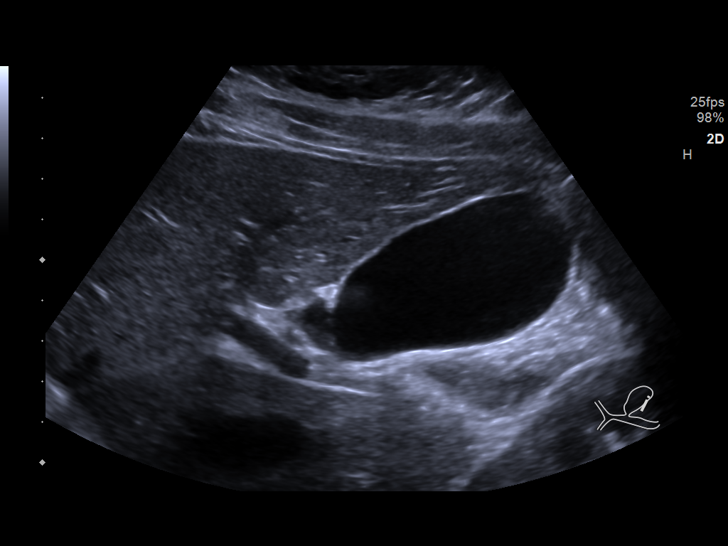
[im 4/41]
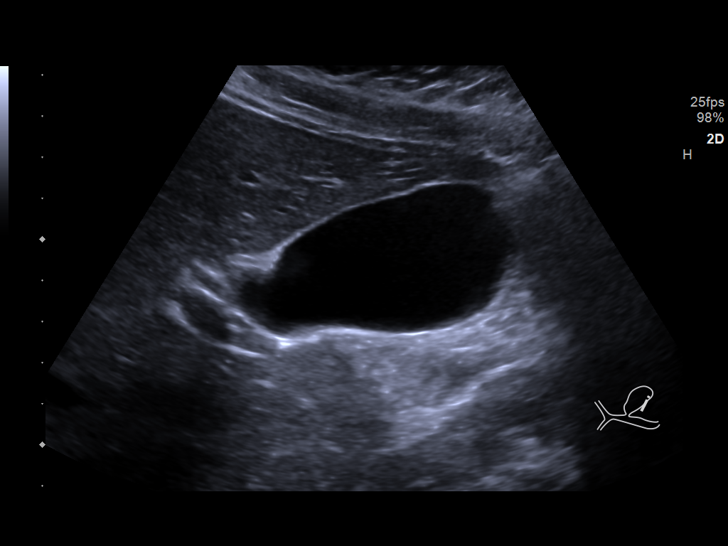
[im 7/41]
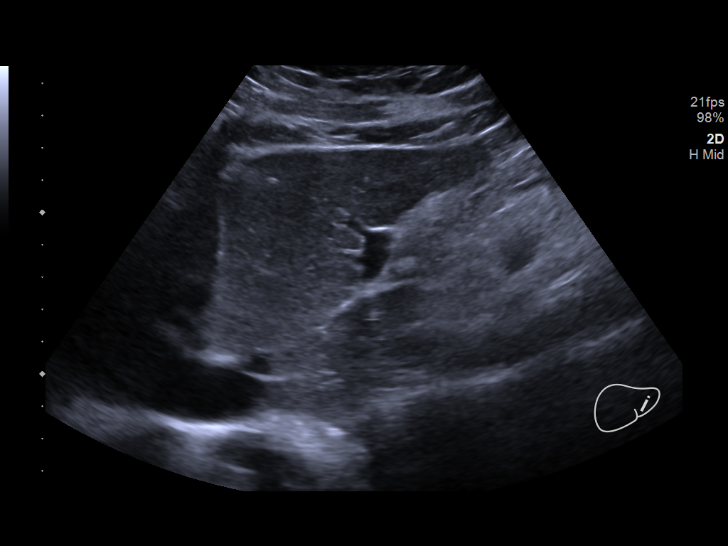
[im 11/41]
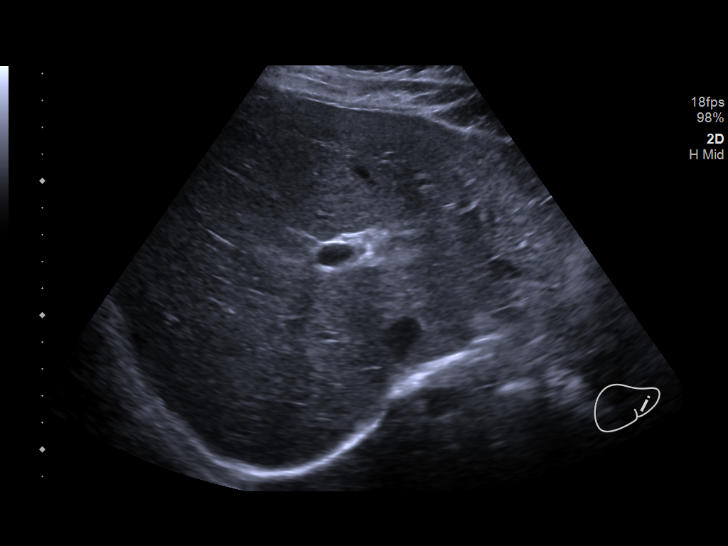
[im 14/41]
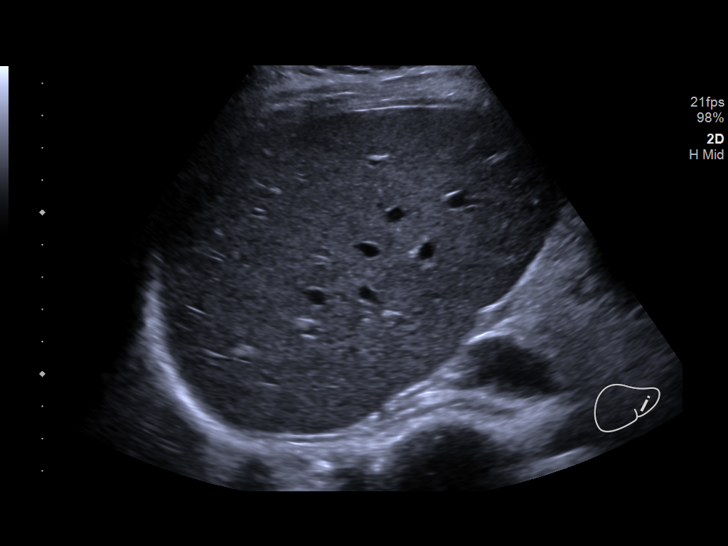
[im 16/41]
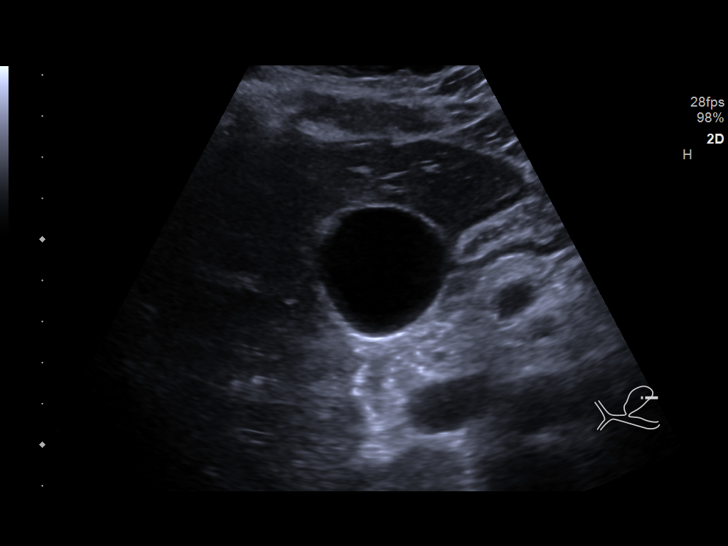
[im 19/41]
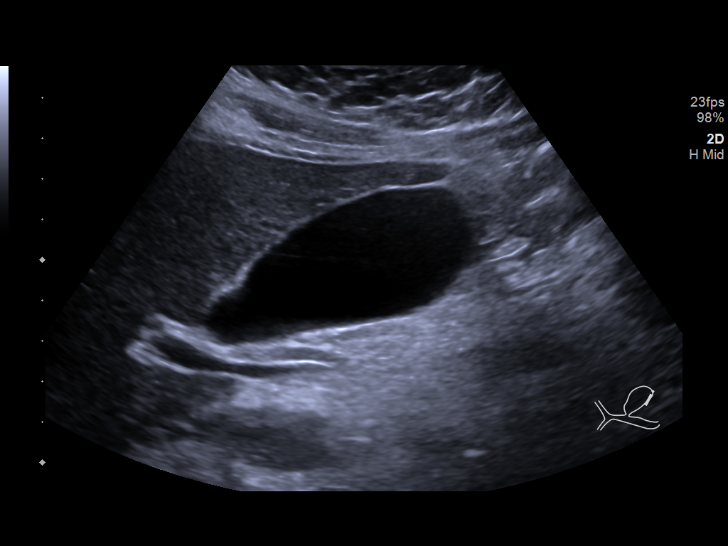
[im 22/41]
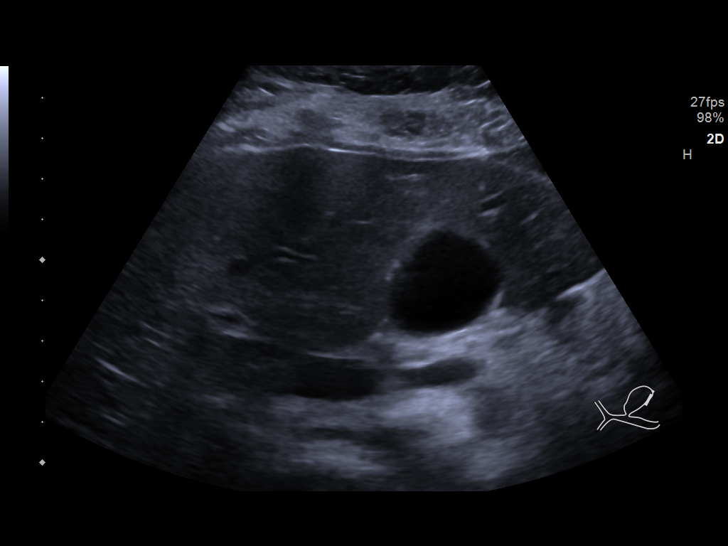
[im 26/41]
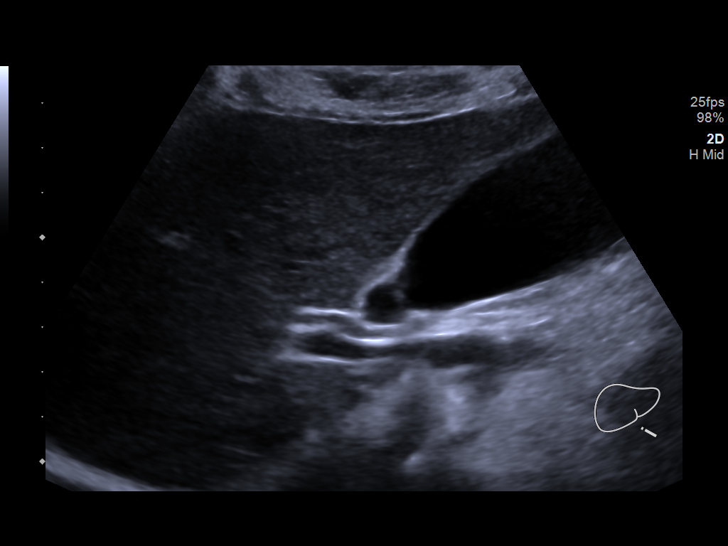
[im 27/41]
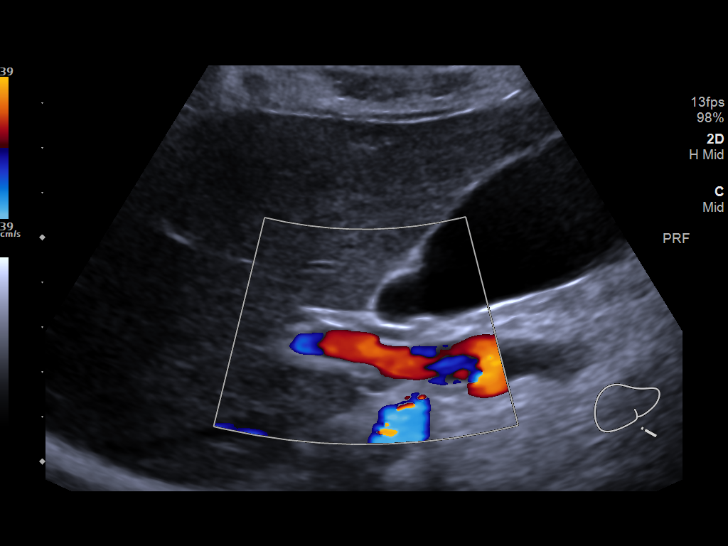
[im 31/41]
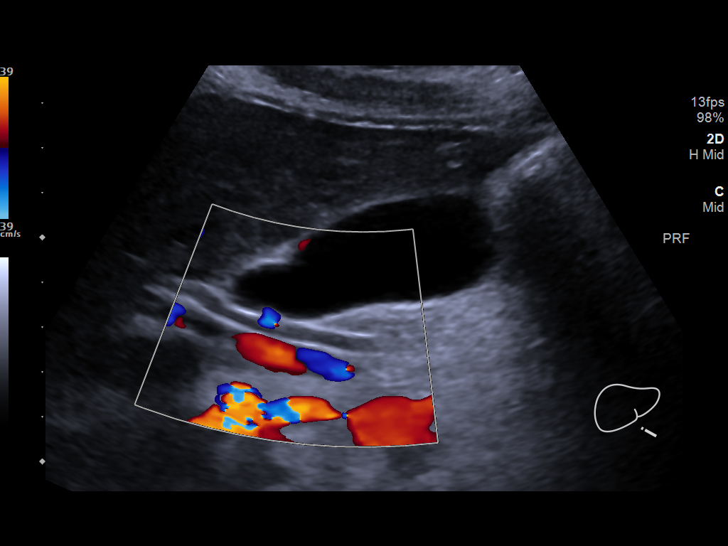
[im 34/41]
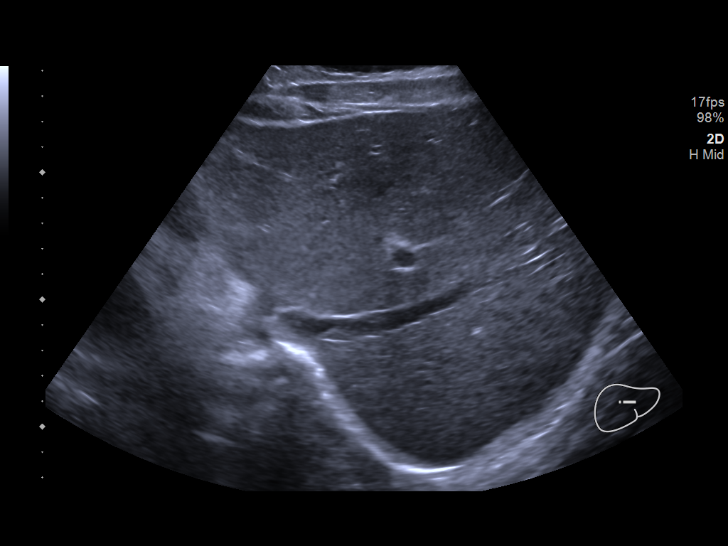
[im 37/41]
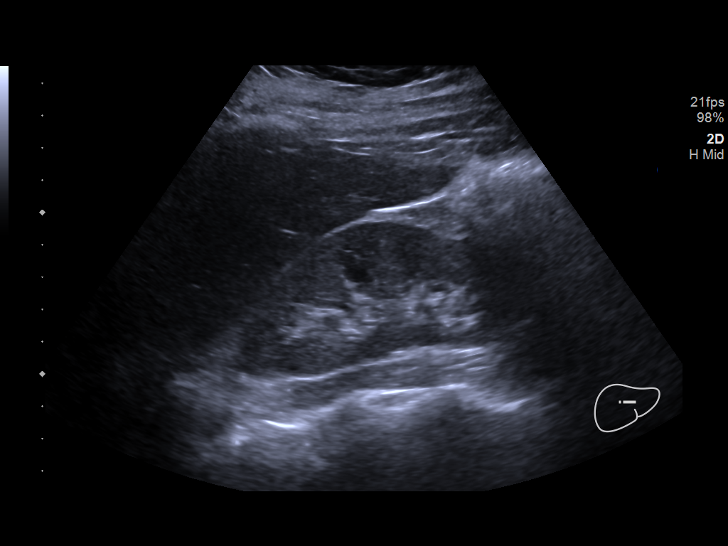
[im 41/41]
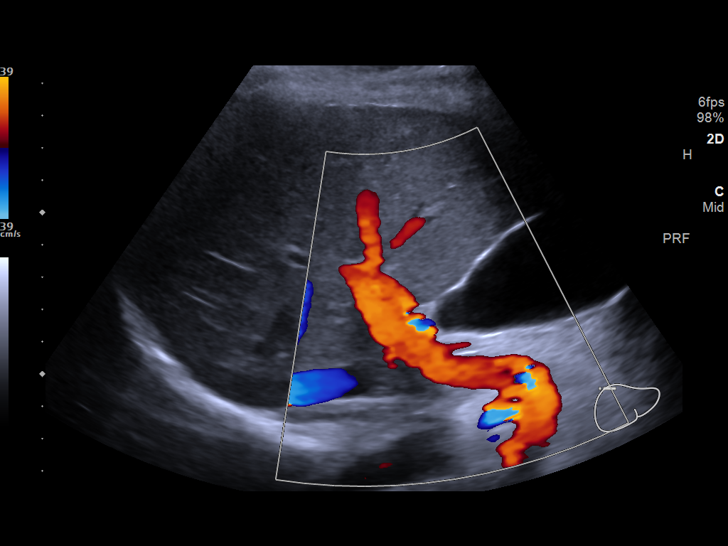

[14 of 25 positions shown; findings below may reference images not displayed]

FINDINGS: Gallbladder:

No gallstones or wall thickening visualized. No sonographic Murphy
sign noted by sonographer.

Common bile duct:

Diameter: 2.8 mm

Liver:

Normal echogenicity without focal lesion or biliary dilatation.
Portal vein is patent on color Doppler imaging with normal direction
of blood flow towards the liver.

Other: None.
IMPRESSION: Normal right upper quadrant ultrasound examination.

## 2021-07-07 ENCOUNTER — Ambulatory Visit: Payer: Self-pay | Admitting: Obstetrics & Gynecology

## 2021-07-09 ENCOUNTER — Ambulatory Visit: Payer: Self-pay | Admitting: Obstetrics & Gynecology

## 2021-08-18 ENCOUNTER — Ambulatory Visit: Payer: Self-pay | Admitting: Obstetrics & Gynecology

## 2023-12-09 ENCOUNTER — Emergency Department (HOSPITAL_COMMUNITY): Payer: Self-pay

## 2023-12-09 ENCOUNTER — Emergency Department (HOSPITAL_COMMUNITY)
Admission: EM | Admit: 2023-12-09 | Discharge: 2023-12-10 | Disposition: A | Discharge status: Home / Self Care | Payer: Self-pay | Attending: Emergency Medicine | Admitting: Emergency Medicine

## 2023-12-09 DIAGNOSIS — S82851A Displaced trimalleolar fracture of right lower leg, initial encounter for closed fracture: Secondary | ICD-10-CM | POA: Insufficient documentation

## 2023-12-09 DIAGNOSIS — M25572 Pain in left ankle and joints of left foot: Secondary | ICD-10-CM | POA: Insufficient documentation

## 2023-12-09 DIAGNOSIS — Z91040 Latex allergy status: Secondary | ICD-10-CM | POA: Insufficient documentation

## 2023-12-09 DIAGNOSIS — W109XXA Fall (on) (from) unspecified stairs and steps, initial encounter: Secondary | ICD-10-CM | POA: Insufficient documentation

## 2023-12-09 LAB — BASIC METABOLIC PANEL WITH GFR
Anion gap: 10 (ref 5–15)
BUN: 8 mg/dL (ref 6–20)
CO2: 22 mmol/L (ref 22–32)
Calcium: 8.8 mg/dL — ABNORMAL LOW (ref 8.9–10.3)
Chloride: 104 mmol/L (ref 98–111)
Creatinine, Ser: 0.64 mg/dL (ref 0.44–1.00)
GFR, Estimated: >60 mL/min (ref >60)
Glucose, Bld: 117 mg/dL — ABNORMAL HIGH (ref 70–99)
Potassium: 3.8 mmol/L (ref 3.5–5.1)
Sodium: 136 mmol/L (ref 135–145)

## 2023-12-09 LAB — CBC WITH DIFFERENTIAL/PLATELET
Abs Immature Granulocytes: 1.55 K/uL — ABNORMAL HIGH (ref 0.00–0.07)
Basophils Absolute: 0.2 K/uL — ABNORMAL HIGH (ref 0.0–0.1)
Basophils Relative: 2 %
Eosinophils Absolute: 0.2 K/uL (ref 0.0–0.5)
Eosinophils Relative: 2 %
HCT: 38.4 % (ref 36.0–46.0)
Hemoglobin: 11.8 g/dL — ABNORMAL LOW (ref 12.0–15.0)
Immature Granulocytes: 12 %
Lymphocytes Relative: 22 %
Lymphs Abs: 2.7 K/uL (ref 0.7–4.0)
MCH: 23.9 pg — ABNORMAL LOW (ref 26.0–34.0)
MCHC: 30.7 g/dL (ref 30.0–36.0)
MCV: 77.7 fL — ABNORMAL LOW (ref 80.0–100.0)
Monocytes Absolute: 0.7 K/uL (ref 0.1–1.0)
Monocytes Relative: 6 %
Neutro Abs: 7.3 K/uL (ref 1.7–7.7)
Neutrophils Relative %: 56 %
Platelets: 401 K/uL — ABNORMAL HIGH (ref 150–400)
RBC: 4.94 MIL/uL (ref 3.87–5.11)
RDW: 20.7 % — ABNORMAL HIGH (ref 11.5–15.5)
WBC: 12.7 K/uL — ABNORMAL HIGH (ref 4.0–10.5)
nRBC: 0.2 % (ref 0.0–0.2)

## 2023-12-09 LAB — HCG, SERUM, QUALITATIVE: Preg, Serum: NEGATIVE

## 2023-12-09 MED ORDER — SODIUM CHLORIDE 0.9 % IV BOLUS
500.0000 mL | Freq: Once | INTRAVENOUS | Status: AC
  Administered 2023-12-09: 500 mL via INTRAVENOUS

## 2023-12-09 MED ORDER — MORPHINE SULFATE (PF) 4 MG/ML IV SOLN
4.0000 mg | Freq: Once | INTRAVENOUS | Status: AC
  Administered 2023-12-09: 4 mg via INTRAVENOUS
  Filled 2023-12-09: qty 1

## 2023-12-09 MED ORDER — OXYCODONE-ACETAMINOPHEN 5-325 MG PO TABS: 1.0000 | ORAL_TABLET | Freq: Four times a day (QID) | ORAL | 0 refills | Status: DC | PRN

## 2023-12-09 MED ORDER — OXYCODONE-ACETAMINOPHEN 5-325 MG PO TABS
1.0000 | ORAL_TABLET | Freq: Once | ORAL | Status: AC
  Administered 2023-12-10: 1 via ORAL
  Filled 2023-12-09: qty 1

## 2023-12-09 MED ORDER — PROPOFOL 10 MG/ML IV BOLUS
0.5000 mg/kg | Freq: Once | INTRAVENOUS | Status: AC
  Administered 2023-12-09: 36.3 mg via INTRAVENOUS
  Filled 2023-12-09: qty 20

## 2023-12-09 MED ORDER — ONDANSETRON HCL 4 MG/2ML IJ SOLN
4.0000 mg | Freq: Once | INTRAMUSCULAR | Status: AC
  Administered 2023-12-09: 4 mg via INTRAVENOUS
  Filled 2023-12-09: qty 2

## 2023-12-09 NOTE — ED Notes (Signed)
 Dr. Doretha administered 20mg  of Propofol .

## 2023-12-09 NOTE — Progress Notes (Signed)
 Called to room for Conscious Sedation and Rt Ankle Reduction. 2201: Patient on 2L O2 via EtCO2 canula. SpO2 100 RR 16 BP 126/97 HR 76 EtCO2 37. Suction and Ambu bag at [facility]. Emergency airway equipment on hand.  VSS throughout procedure without respiratory / airway compromise. Patient awake and conversing with staff. Placed on room air at 2220.  2225: SpO2 100 on room air. EtCO2 39 HR 78 RR 17 BP 124/90. Visitor back at bedside. Patient remains awake and oriented. RN present in room upon RT exit.

## 2023-12-09 NOTE — Progress Notes (Addendum)
 Orthopedic Tech Progress Note Patient Details:  Gina Zimmerman Dec 19, 2000 [id number]  Ortho Devices Type of Ortho Device: Post (short leg) splint, Stirrup splint Ortho Device/Splint Location: rle Ortho Device/Splint Interventions: Ordered, Adjustment, Application  I applied splint post reduction with assist from the dr. Lucia Interventions Patient Tolerated: Well Instructions Provided: Care of device, Adjustment of device  Chandra Dorn PARAS 12/09/2023, 10:42 PM

## 2023-12-09 NOTE — ED Provider Notes (Signed)
 [place] EMERGENCY DEPARTMENT AT [facility] Provider Note   CSN: [id number] Arrival date & time: 12/09/23  2045     Patient presents with: Ankle Pain   Gina Zimmerman is a 23 y.o. female.  {Add pertinent medical, surgical, social history, OB history to HPI:32947} Patient is a healthy 23 year old female who is presenting today with significant ankle pain after a fall.  She was coming down the steps and reports her left leg just gave out on her causing her to fall down 3 steps.  She has severe pain in the right ankle but also some pain in her left ankle.  Deformity noted of the right ankle.  She denies hitting her head, loss of consciousness or pain anywhere else.  She denies any numbness or tingling of the foot.  The history is provided by the patient.       Prior to Admission medications   Medication Sig Start Date End Date Taking? Authorizing Provider  Norethindrone Acetate-Ethinyl Estrad-FE (LOESTRIN 24 FE) 1-20 MG-MCG(24) tablet Take 1 tablet by mouth daily. 12/29/20   Arloa Lamar SQUIBB, MD    Allergies: Latex    Review of Systems  Updated Vital Signs BP (!) 136/91   Pulse 79   Temp 97.8 F (36.6 C) (Oral)   Resp 16   Ht 5' 4 (1.626 m)   Wt 72.6 kg   SpO2 100%   BMI 27.47 kg/m   Physical Exam Vitals and nursing note reviewed.  Constitutional:      General: She is not in acute distress.    Appearance: She is well-developed.  HENT:     Head: Normocephalic and atraumatic.  Eyes:     Conjunctiva/sclera: Conjunctivae normal.     Pupils: Pupils are equal, round, and reactive to light.  Cardiovascular:     Rate and Rhythm: Normal rate and regular rhythm.     Heart sounds: No murmur heard. Pulmonary:     Effort: Pulmonary effort is normal. No respiratory distress.     Breath sounds: Normal breath sounds. No wheezing or rales.  Abdominal:     General: There is no distension.     Palpations: Abdomen is soft.     Tenderness: There is no abdominal  tenderness. There is no guarding or rebound.  Musculoskeletal:        General: Tenderness, deformity and signs of injury present.     Cervical back: Normal range of motion and neck supple.     Right ankle: Swelling and deformity present. Tenderness present over the lateral malleolus and medial malleolus. No proximal fibula tenderness. Decreased range of motion. Normal pulse.     Left ankle: Swelling present. Tenderness present over the lateral malleolus. No medial malleolus tenderness. Decreased range of motion. Normal pulse.  Skin:    General: Skin is warm and dry.     Findings: No erythema or rash.  Neurological:     Mental Status: She is alert and oriented to person, place, and time.  Psychiatric:        Behavior: Behavior normal.     (all labs ordered are listed, but only abnormal results are displayed) Labs Reviewed  CBC WITH DIFFERENTIAL/PLATELET - Abnormal; Notable for the following components:      Result Value   WBC 12.7 (*)    Hemoglobin 11.8 (*)    MCV 77.7 (*)    MCH 23.9 (*)    RDW 20.7 (*)    Platelets 401 (*)    All  other components within normal limits  BASIC METABOLIC PANEL WITH GFR - Abnormal; Notable for the following components:   Glucose, Bld 117 (*)    Calcium 8.8 (*)    All other components within normal limits  HCG, SERUM, QUALITATIVE    EKG: None  Radiology: DG Ankle Complete Left Result Date: 12/09/2023 CLINICAL DATA:  Fall with right ankle fracture. Left ankle pain. Evaluate for left ankle fracture. EXAM: LEFT ANKLE COMPLETE - 3+ VIEW COMPARISON:  None Available. FINDINGS: Three views. There is no evidence of fracture, dislocation, or joint effusion. There is no evidence of arthropathy or other focal bone abnormality. There is mild soft tissue swelling. IMPRESSION: Soft tissue swelling without evidence of fractures. Electronically Signed   By: Francis Quam M.D.   On: 12/09/2023 21:41   DG Ankle Complete Right Result Date: 12/09/2023 CLINICAL  DATA:  Right ankle fracture with deformity.  Fell. EXAM: RIGHT ANKLE - COMPLETE 3+ VIEW COMPARISON:  None. FINDINGS: Three views. Normal bone mineralization. Trimalleolar right ankle fracture is noted. The talus and foot are partially dislocated laterally, and mildly laterally angulated as well as mildly posteriorly displaced. The medial malleolar fragment and the lateral malleolar fracture fragment remain with the talus. The medial malleolar fracture fragment is 1.2 cm displaced laterally relative the parent bone and the lateral malleolar fragment displaced by just over 1 shift laterally, posteriorly displaced 1/2 shaft width relative to the fibula. Both fracture fragments are laterally angulated the same as the talus, with the lateral aspect of the tibial plafond abutting the mid talar dome. There is a vertical fracture through the posterior malleolus, not as well seen due to overlapping structures but it is only slightly inferiorly displaced as well as can be seen. There are no further fractures.  Arthritic changes are not seen. IMPRESSION: Trimalleolar right ankle fracture with lateral dislocation of the talus and foot, and mild lateral angulation and posterior displacement of the fracture fragments. Electronically Signed   By: Francis Quam M.D.   On: 12/09/2023 21:39    {Document cardiac monitor, telemetry assessment procedure when appropriate:32947} .Reduction of fracture  Date/Time: 12/09/2023 10:25 PM  Performed by: Doretha Folks, MD Authorized by: Doretha Folks, MD  Consent: Written consent obtained Consent given by: patient Patient understanding: patient states understanding of the procedure being performed Patient consent: the patient's understanding of the procedure matches consent given Imaging studies: imaging studies available Patient identity confirmed: verbally with patient Time out: Immediately prior to procedure a time out was called to verify the correct patient,  procedure, equipment, support staff and site/side marked as required. Local anesthesia used: no  Anesthesia: Local anesthesia used: no  Sedation: Patient sedated: yes Sedation type: moderate (conscious) sedation Sedatives: propofol  Analgesia: morphine   Patient tolerance: patient tolerated the procedure well with no immediate complications   .Sedation  Date/Time: 12/09/2023 10:25 PM  Performed by: Doretha Folks, MD Authorized by: Doretha Folks, MD   Consent:    Consent obtained:  Written   Consent given by:  Patient   Risks discussed:  Inadequate sedation   Alternatives discussed:  Analgesia without sedation Universal protocol:    Procedure explained and questions answered to patient or proxy's satisfaction: yes     Immediately prior to procedure, a time out was called: yes     Patient identity confirmed:  Anonymous protocol, patient vented/unresponsive Indications:    Procedure performed:  Fracture reduction Pre-sedation assessment:    Time since last food or drink:  5 hours   ASA  classification: class 1 - normal, healthy patient     Mouth opening:  3 or more finger widths   Thyromental distance:  4 finger widths   Mallampati score:  I - soft palate, uvula, fauces, pillars visible   Neck mobility: normal     Pre-sedation assessments completed and reviewed: airway patency, cardiovascular function, hydration status, mental status, nausea/vomiting, pain level, respiratory function and temperature   A pre-sedation assessment was completed prior to the start of the procedure Immediate pre-procedure details:    Reassessment: Patient reassessed immediately prior to procedure     Reviewed: vital signs, relevant labs/tests and NPO status     Verified: bag valve mask available, emergency equipment available, intubation equipment available, IV patency confirmed, oxygen available and suction available   Procedure details (see MAR for exact dosages):    Preoxygenation:  Nasal  cannula   Sedation:  Propofol    Intended level of sedation: deep and moderate (conscious sedation)   Analgesia:  Morphine    Intra-procedure monitoring:  Blood pressure monitoring, cardiac monitor, continuous capnometry, continuous pulse oximetry, frequent LOC assessments and frequent vital sign checks   Intra-procedure events: none     Total Provider sedation time (minutes):  10 Post-procedure details:   A post-sedation assessment was completed following the completion of the procedure.   Attendance: Constant attendance by certified staff until patient recovered     Recovery: Patient returned to pre-procedure baseline     Post-sedation assessments completed and reviewed: airway patency, cardiovascular function, hydration status, mental status, nausea/vomiting, pain level, respiratory function and temperature     Patient is stable for discharge or admission: yes     Procedure completion:  Tolerated well, no immediate complications    Medications Ordered in the ED  propofol  (DIPRIVAN ) 10 mg/mL bolus/IV push 36.3 mg (has no administration in time range)  morphine  (PF) 4 MG/ML injection 4 mg (4 mg Intravenous Given 12/09/23 2119)  ondansetron  (ZOFRAN ) injection 4 mg (4 mg Intravenous Given 12/09/23 2119)  sodium chloride  0.9 % bolus 500 mL (500 mLs Intravenous New Bag/Given 12/09/23 2200)      {Click here for ABCD2, HEART and other calculators REFRESH Note before signing:1}                              Medical Decision Making Amount and/or Complexity of Data Reviewed Labs: ordered. Radiology: ordered.  Risk Prescription drug management.   Pt presenting today with a complaint that caries a high risk for morbidity and mortality. Here today after falling down the steps with an obvious deformity of the right ankle and concern for bimalleolar or trimalleolar fracture of the ankle.  Neurovascularly intact at this time.  Also pain in the left ankle but no deformity and suspects sprain versus  fracture.  No fibular head tenderness concerning for Maisonneuve fracture.  She denies any head injury.  Patient given pain control.  Plain films pending.  10:23 PM I have independently visualized and interpreted pt's images today.  Left ankle image without acute fracture.  Right ankle with fracture dislocation.  Radiology reports right trimalleolar fracture with lateral dislocation of the talus and foot and mild lateral angulation and posterior displacement of the fracture fragments.  Patient was sedated with propofol  and a splint was placed after reduction.  Will get postreduction imaging.  I dependently interpreted patient's labs and CBC and BMP without acute findings.  Pregnancy is negative.  10:54 PM Repeat imagings shows better  alignment of the fracture.  Spoke to Dr. Barton {Document critical care time when appropriate  Document review of labs and clinical decision tools ie CHADS2VASC2, etc  Document your independent review of radiology images and any outside records  Document your discussion with family members, caretakers and with consultants  Document social determinants of health affecting pt's care  Document your decision making why or why not admission, treatments were needed:32947:::1}   Final diagnoses:  None    ED Discharge Orders     None

## 2023-12-09 NOTE — ED Triage Notes (Signed)
 BIBA from home for ankle injury- Obvious deformity to right side, mechanical fall down steps. 200 mcg  fentanyl  given PTA 120/64 bp 82 hr 97% r/a

## 2023-12-10 NOTE — Discharge Instructions (Signed)
 Take aspirin twice daily

## 2023-12-10 NOTE — ED Provider Notes (Signed)
 Care assumed at 2300.  Patient with right-sided trimalleolar fracture status post reduction.  Discussed with Dr. Barton with orthopedics.  Plan to discharge home with outpatient orthopedics follow-up and return precautions.   Griselda Norris, MD 12/10/23 [phone]

## 2023-12-10 NOTE — Consult Note (Addendum)
 Patient ID: Gina Zimmerman MRN: [id number] DOB/AGE: August 04, 2000 23 y.o.  Admit date: 12/09/2023  Admission Diagnoses:  Right trimalleolar ankle fracture dislocation  HPI: Ortho consult for closed right trimalleolar ankle fracture dislocation, DOI 12/09/23. Pt fell down 3 stairs. Closed reduced in ER.   PMH otherwise healthy. 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  Denies numbness/tingling.  Past Medical History: No past medical history on file.  Surgical History: Past Surgical History:  Procedure Laterality Date   LAPAROSCOPIC APPENDECTOMY N/A 06/18/2019   Procedure: APPENDECTOMY LAPAROSCOPIC;  Surgeon: Signe Mitzie LABOR, MD;  Location: [facility] OR;  Service: General;  Laterality: N/A;    Family History: No family history on file.  Social History: Social History   Socioeconomic History   Marital status: Single    Spouse name: Not on file   Number of children: Not on file   Years of education: Not on file   Highest education level: Not on file  Occupational History   Not on file  Tobacco Use   Smoking status: Never   Smokeless tobacco: Never  Vaping Use   Vaping status: Never Used  Substance and Sexual Activity   Alcohol use: Yes   Drug use: No   Sexual activity: Yes    Birth control/protection: None  Other Topics Concern   Not on file  Social History Narrative   Not on file   Social Drivers of Health   Financial Resource Strain: Not on file  Food Insecurity: Not on file  Transportation Needs: Not on file  Physical Activity: Not on file  Stress: Not on file  Social Connections: Not on file  Intimate Partner Violence: Not on file    Allergies: Latex  Medications: I have reviewed the patient's current medications.  Vital Signs: Patient Vitals for the past 24 hrs:  BP Temp Temp src Pulse Resp SpO2 Height Weight  12/10/23 0043 -- 98.1 F (36.7 C) Oral -- -- -- -- --  12/09/23 2330 115/80 -- -- 81 15 98 % -- --  12/09/23 2300 123/83 -- -- 77 16  100 % -- --  12/09/23 2230 125/87 -- -- 71 15 100 % -- --  12/09/23 2221 (!) 124/90 -- -- 74 12 100 % -- --  12/09/23 2218 (!) 136/91 -- -- 79 16 100 % -- --  12/09/23 2217 (!) 123/98 -- -- 89 13 100 % -- --  12/09/23 2215 (!) 134/100 -- -- (!) 102 16 100 % -- --  12/09/23 2145 120/85 -- -- 72 18 99 % -- --  12/09/23 2119 -- -- -- -- -- -- 5' 4 (1.626 m) 72.6 kg  12/09/23 2051 123/85 97.8 F (36.6 C) Oral 71 20 100 % -- --    Radiology: CT Ankle Right Wo Contrast Result Date: 12/10/2023 CLINICAL DATA:  Trimalleolar right ankle fracture with closed reduction. EXAM: CT OF THE RIGHT ANKLE WITHOUT CONTRAST TECHNIQUE: Multidetector CT imaging of the right ankle was performed according to the standard protocol. Multiplanar CT image reconstructions were also generated. RADIATION DOSE REDUCTION: This exam was performed according to the departmental dose-optimization program which includes automated exposure control, adjustment of the mA and/or kV according to patient size and/or use of iterative reconstruction technique. COMPARISON:  Post closed reduction x-rays earlier today. FINDINGS: Bones/Joint/Cartilage There is fiberglass casting material in place, open anteriorly. Trimalleolar fracture is again noted. The posterior malleolar longitudinal fracture is slightly posteriorly distracted about 3 mm but otherwise nondisplaced, with tiny comminution fragments between the  fracture margins. The oblique lateral malleolar fracture fragment is similarly distracted with the distal fragment posteriorly displaced by 2 cortex widths. There is slight comminution between the medial fracture margins. The transverse medial malleolar fragment is displaced laterally about 1 cortex width, slightly distracted and with tiny comminution fragments posteriorly. There is a mild widening along the medial ankle mortise due to a slight residual lateral talar subluxation. The talar dome contour is well preserved. No further fractures  are seen. Arthritic changes are not seen. There is a bone island in the anterior process of the talus. No focal pathologic process is seen. Ligaments Suboptimally assessed by CT. Muscles and Tendons The also not well seen with CT. No through and through tendon disruption is evident. There is a grossly normal muscle bulk with no regional intramuscular hematomas. Soft tissues Loosely clustered subcutaneous air droplets are noted anterior to the distal tibial metaphysis, unclear if this indicates an open fracture component but there is no intra-articular gas. No other soft tissue gas is seen. There is diffuse circumferential edema at the ankle but no localizing fluid collection. No mass. IMPRESSION: 1. Trimalleolar fracture with mild distraction and displacement of the fracture fragments, and slight residual lateral talar subluxation. 2. Loosely clustered subcutaneous air droplets anterior to the distal tibial metaphysis, unclear if this indicates an open fracture component but there is no intra-articular or other soft tissue gas. 3. Diffuse circumferential edema at the ankle. 4. Casting material in place, open anteriorly. Electronically Signed   By: Francis Quam M.D.   On: 12/10/2023 00:25   DG Ankle Complete Right Result Date: 12/09/2023 CLINICAL DATA:  Right ankle fracture, post reduction imaging EXAM: RIGHT ANKLE - COMPLETE 3+ VIEW COMPARISON:  9:21 p.m. FINDINGS: Trimalleolar right ankle fracture is again visualized with interval closed reduction of fracture fragments. Fibular fracture fragment demonstrates 2 cortical with posterior displacement and near anatomic alignment. Medial malleolar fracture fragment demonstrates roughly 5 mm distraction of the fracture fragments with near anatomic alignment. There is near anatomic alignment of the posterior malleolar fracture fragment. Tibiotalar alignment has been restored. Extensive surrounding soft tissue swelling. External immobilizer now place. IMPRESSION: 1.  Interval closed reduction of trimalleolar right ankle fracture as described above. Electronically Signed   By: Dorethia Molt M.D.   On: 12/09/2023 23:08   DG Ankle Complete Left Result Date: 12/09/2023 CLINICAL DATA:  Fall with right ankle fracture. Left ankle pain. Evaluate for left ankle fracture. EXAM: LEFT ANKLE COMPLETE - 3+ VIEW COMPARISON:  None Available. FINDINGS: Three views. There is no evidence of fracture, dislocation, or joint effusion. There is no evidence of arthropathy or other focal bone abnormality. There is mild soft tissue swelling. IMPRESSION: Soft tissue swelling without evidence of fractures. Electronically Signed   By: Francis Quam M.D.   On: 12/09/2023 21:41   DG Ankle Complete Right Result Date: 12/09/2023 CLINICAL DATA:  Right ankle fracture with deformity.  Fell. EXAM: RIGHT ANKLE - COMPLETE 3+ VIEW COMPARISON:  None. FINDINGS: Three views. Normal bone mineralization. Trimalleolar right ankle fracture is noted. The talus and foot are partially dislocated laterally, and mildly laterally angulated as well as mildly posteriorly displaced. The medial malleolar fragment and the lateral malleolar fracture fragment remain with the talus. The medial malleolar fracture fragment is 1.2 cm displaced laterally relative the parent bone and the lateral malleolar fragment displaced by just over 1 shift laterally, posteriorly displaced 1/2 shaft width relative to the fibula. Both fracture fragments are laterally angulated the same as the  talus, with the lateral aspect of the tibial plafond abutting the mid talar dome. There is a vertical fracture through the posterior malleolus, not as well seen due to overlapping structures but it is only slightly inferiorly displaced as well as can be seen. There are no further fractures.  Arthritic changes are not seen. IMPRESSION: Trimalleolar right ankle fracture with lateral dislocation of the talus and foot, and mild lateral angulation and posterior  displacement of the fracture fragments. Electronically Signed   By: Francis Quam M.D.   On: 12/09/2023 21:39    Labs: Recent Labs    12/09/23 2118  WBC 12.7*  RBC 4.94  HCT 38.4  PLT 401*   Recent Labs    12/09/23 2118  NA 136  K 3.8  CL 104  CO2 22  BUN 8  CREATININE 0.64  GLUCOSE 117*  CALCIUM 8.8*   No results for input(s): LABPT, INR in the last 72 hours.  Review of Systems: ROS as detailed in HPI  Physical Exam: Body mass index is 27.47 kg/m.  Physical Exam   Gen: AAOx3, NAD Comfortable at rest  Gen: AAOx3, NAD  Right lower extremity: Short leg splint in place Wiggles toes SILT over toes CR<2s    Assessment and Plan: Ortho consult for closed right trimalleolar ankle fracture dislocation, DOI 12/09/23  -history, exam and imaging reviewed at length with patient/family -plan for right ankle ORIF, possible syndesmosis and/or deltoid fixation, possible allograft, possible external fixation -strict NWB, max elevation -ice application PRN -ASA 81 mg BID for VTE ppx -ER gave her pain rx for homegoing -will call pt to schedule surgery (anticipate 12/20/23 at [facility]) as well as preop appt for skin check  The risks and benefits were presented and reviewed. The risks due to hardware failure/irritation, new/persistent/recurrent infection, stiffness, nerve/vessel/tendon injury, nonunion/malunion of any fracture, wound healing issues, allograft usage, development of arthritis, failure of this surgery, possibility of external fixation in certain situations, possibility of delayed definitive surgery, need for further surgery, prolonged wound care including further soft tissue coverage procedures, thromboembolic events, anesthesia/medical complications/events perioperatively and beyond, amputation, death among others were discussed. The patient acknowledged the explanation, agreed to proceed with the plan.  Lillia Mountain, MD Orthopaedic  Surgeon [facility] [phone]

## 2023-12-13 ENCOUNTER — Encounter (HOSPITAL_BASED_OUTPATIENT_CLINIC_OR_DEPARTMENT_OTHER): Payer: Self-pay | Admitting: Orthopaedic Surgery

## 2023-12-15 NOTE — H&P (Signed)
 Patient ID: Gina Zimmerman MRN: [id number] DOB/AGE: 09/10/2000 23 y.o.   Admit date: 12/09/2023   Admission Diagnoses:  Right trimalleolar ankle fracture dislocation   HPI: Ortho consult for closed right trimalleolar ankle fracture dislocation, DOI 12/09/23. Pt fell down 3 stairs. Closed reduced in ER.    PMH otherwise healthy. 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   Denies numbness/tingling.   Past Medical History: No past medical history on file.       Surgical History:      Past Surgical History:  Procedure Laterality Date   LAPAROSCOPIC APPENDECTOMY N/A 06/18/2019    Procedure: APPENDECTOMY LAPAROSCOPIC;  Surgeon: Signe Mitzie LABOR, MD;  Location: [facility] OR;  Service: General;  Laterality: N/A;          Family History: No family history on file.       Social History: Social History         Socioeconomic History   Marital status: Single      Spouse name: Not on file   Number of children: Not on file   Years of education: Not on file   Highest education level: Not on file  Occupational History   Not on file  Tobacco Use   Smoking status: Never   Smokeless tobacco: Never  Vaping Use   Vaping status: Never Used  Substance and Sexual Activity   Alcohol use: Yes   Drug use: No   Sexual activity: Yes      Birth control/protection: None  Other Topics Concern   Not on file  Social History Narrative   Not on file    Social Drivers of Health    Financial Resource Strain: Not on file  Food Insecurity: Not on file  Transportation Needs: Not on file  Physical Activity: Not on file  Stress: Not on file  Social Connections: Not on file  Intimate Partner Violence: Not on file      Allergies: Latex   Medications: I have reviewed the patient's current medications.   Vital Signs: Patient Vitals for the past 24 hrs:   BP Temp Temp src Pulse Resp SpO2 Height Weight  12/10/23 0043 -- 98.1 F (36.7 C) Oral -- -- -- -- --  12/09/23 2330 115/80 -- -- 81  15 98 % -- --  12/09/23 2300 123/83 -- -- 77 16 100 % -- --  12/09/23 2230 125/87 -- -- 71 15 100 % -- --  12/09/23 2221 (!) 124/90 -- -- 74 12 100 % -- --  12/09/23 2218 (!) 136/91 -- -- 79 16 100 % -- --  12/09/23 2217 (!) 123/98 -- -- 89 13 100 % -- --  12/09/23 2215 (!) 134/100 -- -- (!) 102 16 100 % -- --  12/09/23 2145 120/85 -- -- 72 18 99 % -- --  12/09/23 2119 -- -- -- -- -- -- 5' 4 (1.626 m) 72.6 kg  12/09/23 2051 123/85 97.8 F (36.6 C) Oral 71 20 100 % -- --      Radiology:  Imaging Results  CT Ankle Right Wo Contrast Result Date: 12/10/2023 CLINICAL DATA:  Trimalleolar right ankle fracture with closed reduction. EXAM: CT OF THE RIGHT ANKLE WITHOUT CONTRAST TECHNIQUE: Multidetector CT imaging of the right ankle was performed according to the standard protocol. Multiplanar CT image reconstructions were also generated. RADIATION DOSE REDUCTION: This exam was performed according to the departmental dose-optimization program which includes automated exposure control, adjustment of the mA and/or kV according to patient size and/or use  of iterative reconstruction technique. COMPARISON:  Post closed reduction x-rays earlier today. FINDINGS: Bones/Joint/Cartilage There is fiberglass casting material in place, open anteriorly. Trimalleolar fracture is again noted. The posterior malleolar longitudinal fracture is slightly posteriorly distracted about 3 mm but otherwise nondisplaced, with tiny comminution fragments between the fracture margins. The oblique lateral malleolar fracture fragment is similarly distracted with the distal fragment posteriorly displaced by 2 cortex widths. There is slight comminution between the medial fracture margins. The transverse medial malleolar fragment is displaced laterally about 1 cortex width, slightly distracted and with tiny comminution fragments posteriorly. There is a mild widening along the medial ankle mortise due to a slight residual lateral talar  subluxation. The talar dome contour is well preserved. No further fractures are seen. Arthritic changes are not seen. There is a bone island in the anterior process of the talus. No focal pathologic process is seen. Ligaments Suboptimally assessed by CT. Muscles and Tendons The also not well seen with CT. No through and through tendon disruption is evident. There is a grossly normal muscle bulk with no regional intramuscular hematomas. Soft tissues Loosely clustered subcutaneous air droplets are noted anterior to the distal tibial metaphysis, unclear if this indicates an open fracture component but there is no intra-articular gas. No other soft tissue gas is seen. There is diffuse circumferential edema at the ankle but no localizing fluid collection. No mass. IMPRESSION: 1. Trimalleolar fracture with mild distraction and displacement of the fracture fragments, and slight residual lateral talar subluxation. 2. Loosely clustered subcutaneous air droplets anterior to the distal tibial metaphysis, unclear if this indicates an open fracture component but there is no intra-articular or other soft tissue gas. 3. Diffuse circumferential edema at the ankle. 4. Casting material in place, open anteriorly. Electronically Signed   By: Francis Quam M.D.   On: 12/10/2023 00:25    DG Ankle Complete Right Result Date: 12/09/2023 CLINICAL DATA:  Right ankle fracture, post reduction imaging EXAM: RIGHT ANKLE - COMPLETE 3+ VIEW COMPARISON:  9:21 p.m. FINDINGS: Trimalleolar right ankle fracture is again visualized with interval closed reduction of fracture fragments. Fibular fracture fragment demonstrates 2 cortical with posterior displacement and near anatomic alignment. Medial malleolar fracture fragment demonstrates roughly 5 mm distraction of the fracture fragments with near anatomic alignment. There is near anatomic alignment of the posterior malleolar fracture fragment. Tibiotalar alignment has been restored. Extensive  surrounding soft tissue swelling. External immobilizer now place. IMPRESSION: 1. Interval closed reduction of trimalleolar right ankle fracture as described above. Electronically Signed   By: Dorethia Molt M.D.   On: 12/09/2023 23:08    DG Ankle Complete Left Result Date: 12/09/2023 CLINICAL DATA:  Fall with right ankle fracture. Left ankle pain. Evaluate for left ankle fracture. EXAM: LEFT ANKLE COMPLETE - 3+ VIEW COMPARISON:  None Available. FINDINGS: Three views. There is no evidence of fracture, dislocation, or joint effusion. There is no evidence of arthropathy or other focal bone abnormality. There is mild soft tissue swelling. IMPRESSION: Soft tissue swelling without evidence of fractures. Electronically Signed   By: Francis Quam M.D.   On: 12/09/2023 21:41    DG Ankle Complete Right Result Date: 12/09/2023 CLINICAL DATA:  Right ankle fracture with deformity.  Fell. EXAM: RIGHT ANKLE - COMPLETE 3+ VIEW COMPARISON:  None. FINDINGS: Three views. Normal bone mineralization. Trimalleolar right ankle fracture is noted. The talus and foot are partially dislocated laterally, and mildly laterally angulated as well as mildly posteriorly displaced. The medial malleolar fragment and the lateral  malleolar fracture fragment remain with the talus. The medial malleolar fracture fragment is 1.2 cm displaced laterally relative the parent bone and the lateral malleolar fragment displaced by just over 1 shift laterally, posteriorly displaced 1/2 shaft width relative to the fibula. Both fracture fragments are laterally angulated the same as the talus, with the lateral aspect of the tibial plafond abutting the mid talar dome. There is a vertical fracture through the posterior malleolus, not as well seen due to overlapping structures but it is only slightly inferiorly displaced as well as can be seen. There are no further fractures.  Arthritic changes are not seen. IMPRESSION: Trimalleolar right ankle fracture with  lateral dislocation of the talus and foot, and mild lateral angulation and posterior displacement of the fracture fragments. Electronically Signed   By: Francis Quam M.D.   On: 12/09/2023 21:39       Labs: Recent Labs (last 2 labs)     Recent Labs    12/09/23 2118  WBC 12.7*  RBC 4.94  HCT 38.4  PLT 401*      Recent Labs (last 2 labs)     Recent Labs    12/09/23 2118  NA 136  K 3.8  CL 104  CO2 22  BUN 8  CREATININE 0.64  GLUCOSE 117*  CALCIUM 8.8*      Recent Labs (last 2 labs)  No results for input(s): LABPT, INR in the last 72 hours.     Review of Systems: ROS as detailed in HPI   Physical Exam: Body mass index is 27.47 kg/m.   Physical Exam    Gen: AAOx3, NAD Comfortable at rest   Gen: AAOx3, NAD   Right lower extremity: Short leg splint in place Wiggles toes SILT over toes CR<2s       Assessment and Plan: Ortho consult for closed right trimalleolar ankle fracture dislocation, DOI 12/09/23   -history, exam and imaging reviewed at length with patient/family -plan for right ankle ORIF, possible syndesmosis and/or deltoid fixation, possible allograft, possible external fixation -strict NWB, max elevation -ice application PRN -ASA 81 mg BID for VTE ppx -ER gave her pain rx for homegoing -will call pt to schedule surgery (anticipate 12/20/23 at [address] [facility] Day) as well as preop appt for skin check   The risks and benefits were presented and reviewed. The risks due to hardware failure/irritation, new/persistent/recurrent infection, stiffness, nerve/vessel/tendon injury, nonunion/malunion of any fracture, wound healing issues, allograft usage, development of arthritis, failure of this surgery, possibility of external fixation in certain situations, possibility of delayed definitive surgery, need for further surgery, prolonged wound care including further soft tissue coverage procedures, thromboembolic events, anesthesia/medical complications/events  perioperatively and beyond, amputation, death among others were discussed. The patient acknowledged the explanation, agreed to proceed with the plan.   Lillia Mountain, MD Orthopaedic Surgeon [facility] [phone]

## 2023-12-15 NOTE — Discharge Instructions (Addendum)
 Lillia Mountain, MD [facility]  Please read the following information regarding your care after surgery.  Medications  You only need a prescription for the narcotic pain medicine (ex. oxycodone , Percocet, Norco).  All of the other medicines listed below are available over the counter. ? Aleve 2 pills twice a day for the first 3 days after surgery. ? acetominophen (Tylenol ) 650 mg every 4-6 hours as you need for minor to moderate pain ? oxycodone  as prescribed for severe pain  ? To help prevent blood clots, take aspirin (81 mg) twice daily for at least 42 days after surgery.  You should also get up every hour while you are awake to move around.  Weight Bearing ? Do NOT bear any weight on the operated leg or foot. This means do NOT touch your surgical leg to the ground!  Cast / Splint / Dressing ? If you have a splint, do NOT remove this. Keep your splint, cast or dressing clean and dry.  Don't put anything (coat hanger, pencil, etc) down inside of it.  If it gets wet, call the office immediately to schedule an appointment for a cast change.  Swelling IMPORTANT: It is normal for you to have swelling where you had surgery. To reduce swelling and pain, keep at least 3 pillows under your leg so that your toes are above your nose and your heel is above the level of your hip.  It may be necessary to keep your foot or leg elevated for several weeks.  This is critical to helping your incisions heal and your pain to feel better.  Follow Up Call my office at [phone] when you are discharged from the hospital or surgery center to schedule an appointment to be seen 7-10 days after surgery.  Call my office at [phone] if you develop a fever >101.5 F, nausea, vomiting, bleeding from the surgical site or severe pain.     Post Anesthesia Home Care Instructions  Activity: Get plenty of rest for the remainder of the day. A responsible individual must stay with you for 24 hours following the  procedure.  For the next 24 hours, DO NOT: -Drive a car -Advertising copywriter -Drink alcoholic beverages -Take any medication unless instructed by your physician -Make any legal decisions or sign important papers.  Meals: Start with liquid foods such as gelatin or soup. Progress to regular foods as tolerated. Avoid greasy, spicy, heavy foods. If nausea and/or vomiting occur, drink only clear liquids until the nausea and/or vomiting subsides. Call your physician if vomiting continues.  Special Instructions/Symptoms: Your throat may feel dry or sore from the anesthesia or the breathing tube placed in your throat during surgery. If this causes discomfort, gargle with warm salt water . The discomfort should disappear within 24 hours.  If you had a scopolamine  patch placed behind your ear for the management of post- operative nausea and/or vomiting:  1. The medication in the patch is effective for 72 hours, after which it should be removed.  Wrap patch in a tissue and discard in the trash. Wash hands thoroughly with soap and water . 2. You may remove the patch earlier than 72 hours if you experience unpleasant side effects which may include dry mouth, dizziness or visual disturbances. 3. Avoid touching the patch. Wash your hands with soap and water  after contact with the patch.   Regional Anesthesia Blocks  1. You may not be able to move or feel the blocked extremity after a regional anesthetic block. This may last may  last from 3-48 hours after placement, but it will go away. The length of time depends on the medication injected and your individual response to the medication. As the nerves start to wake up, you may experience tingling as the movement and feeling returns to your extremity. If the numbness and inability to move your extremity has not gone away after 48 hours, please call your surgeon.   2. The extremity that is blocked will need to be protected until the numbness is gone and the  strength has returned. Because you cannot feel it, you will need to take extra care to avoid injury. Because it may be weak, you may have difficulty moving it or using it. You may not know what position it is in without looking at it while the block is in effect.  3. For blocks in the legs and feet, returning to weight bearing and walking needs to be done carefully. You will need to wait until the numbness is entirely gone and the strength has returned. You should be able to move your leg and foot normally before you try and bear weight or walk. You will need someone to be with you when you first try to ensure you do not fall and possibly risk injury.  4. Bruising and tenderness at the needle site are common side effects and will resolve in a few days.  5. Persistent numbness or new problems with movement should be communicated to the surgeon or the [facility] [phone] [facility] [phone]).

## 2023-12-20 ENCOUNTER — Other Ambulatory Visit: Payer: Self-pay

## 2023-12-20 ENCOUNTER — Ambulatory Visit (HOSPITAL_BASED_OUTPATIENT_CLINIC_OR_DEPARTMENT_OTHER): Payer: Self-pay | Admitting: Certified Registered"

## 2023-12-20 ENCOUNTER — Encounter (HOSPITAL_BASED_OUTPATIENT_CLINIC_OR_DEPARTMENT_OTHER): Payer: Self-pay | Admitting: Orthopaedic Surgery

## 2023-12-20 ENCOUNTER — Ambulatory Visit (HOSPITAL_COMMUNITY): Payer: Self-pay

## 2023-12-20 ENCOUNTER — Ambulatory Visit (HOSPITAL_BASED_OUTPATIENT_CLINIC_OR_DEPARTMENT_OTHER)
Admission: RE | Admit: 2023-12-20 | Discharge: 2023-12-20 | Disposition: A | Discharge status: Home / Self Care | Payer: Self-pay | Attending: Orthopaedic Surgery | Admitting: Orthopaedic Surgery

## 2023-12-20 ENCOUNTER — Encounter (HOSPITAL_BASED_OUTPATIENT_CLINIC_OR_DEPARTMENT_OTHER)
Admission: RE | Disposition: A | Discharge status: Home / Self Care | Payer: Self-pay | Source: Home / Self Care | Attending: Orthopaedic Surgery

## 2023-12-20 DIAGNOSIS — W109XXA Fall (on) (from) unspecified stairs and steps, initial encounter: Secondary | ICD-10-CM | POA: Insufficient documentation

## 2023-12-20 DIAGNOSIS — S82851A Displaced trimalleolar fracture of right lower leg, initial encounter for closed fracture: Secondary | ICD-10-CM | POA: Insufficient documentation

## 2023-12-20 HISTORY — DX: Allergy status to unspecified drugs, medicaments and biological substances: Z88.9

## 2023-12-20 HISTORY — PX: SYNDESMOSIS REPAIR: SHX5182

## 2023-12-20 HISTORY — DX: Qualitative platelet defects: D69.1

## 2023-12-20 HISTORY — PX: ORIF ANKLE FRACTURE: SHX5408

## 2023-12-20 LAB — POCT PREGNANCY, URINE: Preg Test, Ur: NEGATIVE

## 2023-12-20 SURGERY — OPEN REDUCTION INTERNAL FIXATION (ORIF) ANKLE FRACTURE
Anesthesia: General | Site: Ankle | Laterality: Right

## 2023-12-20 MED ORDER — VANCOMYCIN HCL 500 MG IV SOLR
INTRAVENOUS | Status: DC | PRN
  Administered 2023-12-20: 500 mg via TOPICAL

## 2023-12-20 MED ORDER — SODIUM CHLORIDE 0.9 % IV SOLN
12.5000 mg | INTRAVENOUS | Status: DC | PRN
  Filled 2023-12-20: qty 0.5

## 2023-12-20 MED ORDER — DEXAMETHASONE SODIUM PHOSPHATE 10 MG/ML IJ SOLN
INTRAMUSCULAR | Status: DC | PRN
  Administered 2023-12-20: 5 mg via INTRAVENOUS

## 2023-12-20 MED ORDER — AMISULPRIDE (ANTIEMETIC) 5 MG/2ML IV SOLN: 10.0000 mg | Freq: Once | INTRAVENOUS | Status: DC | PRN

## 2023-12-20 MED ORDER — MIDAZOLAM HCL 2 MG/2ML IJ SOLN
INTRAMUSCULAR | Status: AC
  Filled 2023-12-20: qty 2

## 2023-12-20 MED ORDER — HYDROMORPHONE HCL 1 MG/ML IJ SOLN: 0.2500 mg | INTRAMUSCULAR | Status: DC | PRN

## 2023-12-20 MED ORDER — POVIDONE-IODINE 10 % EX SOLN
CUTANEOUS | Status: DC | PRN
  Administered 2023-12-20: 1 via TOPICAL

## 2023-12-20 MED ORDER — LACTATED RINGERS IV SOLN: INTRAVENOUS | Status: DC | PRN

## 2023-12-20 MED ORDER — OXYCODONE HCL 5 MG/5ML PO SOLN: 5.0000 mg | Freq: Once | ORAL | Status: DC | PRN

## 2023-12-20 MED ORDER — FENTANYL CITRATE (PF) 100 MCG/2ML IJ SOLN
INTRAMUSCULAR | Status: AC
  Filled 2023-12-20: qty 2

## 2023-12-20 MED ORDER — CEFAZOLIN SODIUM-DEXTROSE 2-4 GM/100ML-% IV SOLN: 2.0000 g | INTRAVENOUS | Status: DC

## 2023-12-20 MED ORDER — LACTATED RINGERS IV SOLN: INTRAVENOUS | Status: DC

## 2023-12-20 MED ORDER — CHLORHEXIDINE GLUCONATE 4 % EX SOLN: 60.0000 mL | Freq: Once | CUTANEOUS | Status: DC

## 2023-12-20 MED ORDER — CEFAZOLIN SODIUM-DEXTROSE 2-3 GM-%(50ML) IV SOLR
INTRAVENOUS | Status: DC | PRN
  Administered 2023-12-20: 2 g via INTRAVENOUS

## 2023-12-20 MED ORDER — FENTANYL CITRATE (PF) 100 MCG/2ML IJ SOLN
100.0000 ug | Freq: Once | INTRAMUSCULAR | Status: AC
  Administered 2023-12-20: 100 ug via INTRAVENOUS

## 2023-12-20 MED ORDER — CEFAZOLIN SODIUM-DEXTROSE 2-4 GM/100ML-% IV SOLN
INTRAVENOUS | Status: AC
  Filled 2023-12-20: qty 100

## 2023-12-20 MED ORDER — ONDANSETRON HCL 4 MG/2ML IJ SOLN
INTRAMUSCULAR | Status: DC | PRN
  Administered 2023-12-20: 4 mg via INTRAVENOUS

## 2023-12-20 MED ORDER — MEPERIDINE HCL 25 MG/ML IJ SOLN: 6.2500 mg | INTRAMUSCULAR | Status: DC | PRN

## 2023-12-20 MED ORDER — VANCOMYCIN HCL 500 MG IV SOLR
INTRAVENOUS | Status: AC
  Filled 2023-12-20: qty 10

## 2023-12-20 MED ORDER — ROPIVACAINE HCL 5 MG/ML IJ SOLN
INTRAMUSCULAR | Status: DC | PRN
  Administered 2023-12-20: 50 mL via PERINEURAL

## 2023-12-20 MED ORDER — OXYCODONE HCL 5 MG PO TABS: 5.0000 mg | ORAL_TABLET | Freq: Once | ORAL | Status: DC | PRN

## 2023-12-20 MED ORDER — MIDAZOLAM HCL 2 MG/2ML IJ SOLN
2.0000 mg | Freq: Once | INTRAMUSCULAR | Status: AC
  Administered 2023-12-20: 2 mg via INTRAVENOUS

## 2023-12-20 MED ORDER — 0.9 % SODIUM CHLORIDE (POUR BTL) OPTIME
TOPICAL | Status: DC | PRN
  Administered 2023-12-20: 1000 mL

## 2023-12-20 SURGICAL SUPPLY — 65 items
BIT DRILL 2.5X2.75 QC CALB (BIT) ×1 IMPLANT
BIT DRILL 2.9 CANN QC NONSTRL (BIT) ×1 IMPLANT
BIT DRILL 2.9X70 QC CALB (BIT) ×1 IMPLANT
BLADE SURG 15 STRL LF DISP TIS (BLADE) ×8 IMPLANT
BNDG COHESIVE 4X5 TAN STRL LF (GAUZE/BANDAGES/DRESSINGS) IMPLANT
BNDG COMPR ESMARK 6X3 LF (GAUZE/BANDAGES/DRESSINGS) IMPLANT
BNDG ELASTIC 6X10 VLCR STRL LF (GAUZE/BANDAGES/DRESSINGS) ×2 IMPLANT
BNDG GAUZE DERMACEA FLUFF 4 (GAUZE/BANDAGES/DRESSINGS) ×2 IMPLANT
CANISTER SUCT 1200ML W/VALVE (MISCELLANEOUS) IMPLANT
CHLORAPREP W/TINT 26 (MISCELLANEOUS) ×2 IMPLANT
COVER BACK TABLE 60X90IN (DRAPES) ×2 IMPLANT
CUFF TRNQT CYL 34X4.125X (TOURNIQUET CUFF) ×2 IMPLANT
DRAPE C-ARM 42X72 X-RAY (DRAPES) ×2 IMPLANT
DRAPE C-ARMOR (DRAPES) ×2 IMPLANT
DRAPE EXTREMITY T 121X128X90 (DISPOSABLE) ×2 IMPLANT
DRAPE IMP U-DRAPE 54X76 (DRAPES) ×2 IMPLANT
DRAPE U-SHAPE 47X51 STRL (DRAPES) ×2 IMPLANT
DRIVER RETENTION T15 LONG (ORTHOPEDIC DISPOSABLE SUPPLIES) ×2 IMPLANT
DRSG MEPITEL 4X7.2 (GAUZE/BANDAGES/DRESSINGS) ×2 IMPLANT
ELECTRODE REM PT RTRN 9FT ADLT (ELECTROSURGICAL) ×2 IMPLANT
GAUZE PAD ABD 8X10 STRL (GAUZE/BANDAGES/DRESSINGS) ×2 IMPLANT
GAUZE SPONGE 4X4 12PLY STRL (GAUZE/BANDAGES/DRESSINGS) ×2 IMPLANT
GLOVE BIOGEL PI IND STRL 8 (GLOVE) ×2 IMPLANT
GLOVE SURG SS PI 7.5 STRL IVOR (GLOVE) ×4 IMPLANT
GOWN STRL REUS W/ TWL LRG LVL3 (GOWN DISPOSABLE) ×5 IMPLANT
KWIRE ALPS MXV 1.6X6 ZI (WIRE) ×3 IMPLANT
MARKER SKIN DUAL TIP RULER LAB (MISCELLANEOUS) IMPLANT
NDL HYPO 25X1 1.5 SAFETY (NEEDLE) IMPLANT
NEEDLE HYPO 25X1 1.5 SAFETY (NEEDLE) IMPLANT
NS IRRIG 1000ML POUR BTL (IV SOLUTION) ×2 IMPLANT
PACK BASIN DAY SURGERY FS (CUSTOM PROCEDURE TRAY) ×2 IMPLANT
PAD CAST 4YDX4 CTTN HI CHSV (CAST SUPPLIES) ×2 IMPLANT
PADDING CAST ABS COTTON 4X4 ST (CAST SUPPLIES) IMPLANT
PADDING CAST SYNTHETIC 4X4 STR (CAST SUPPLIES) ×6 IMPLANT
PADDING CAST SYNTHETIC 6X4 NS (CAST SUPPLIES) ×5 IMPLANT
PENCIL SMOKE EVACUATOR (MISCELLANEOUS) ×2 IMPLANT
PLATE 1/3 TUBULAR 6H (Plate) ×1 IMPLANT
SCREW ACE CAN 4.0 40M (Screw) ×1 IMPLANT
SCREW ACE CAN 4.0 55M (Screw) ×1 IMPLANT
SCREW LAG CANC FT 4X48 (Screw) ×1 IMPLANT
SCREW LOCK MDS 3.5X12 (Screw) ×2 IMPLANT
SCREW LOCK MDS 3.5X14 (Screw) ×2 IMPLANT
SCREW NLOCK ALPS 3.5X14 (Screw) ×1 IMPLANT
SCREW NLOCK CANC HEX 4X50 (Screw) ×1 IMPLANT
SCREW NON-LOCK 3.5X12 (Screw) ×1 IMPLANT
SHEET MEDIUM DRAPE 40X70 STRL (DRAPES) ×2 IMPLANT
SLEEVE SCD COMPRESS KNEE MED (STOCKING) ×2 IMPLANT
SPIKE FLUID TRANSFER (MISCELLANEOUS) IMPLANT
SPLINT FIBERGLASS 4X30 (CAST SUPPLIES) ×4 IMPLANT
SPONGE T-LAP 18X18 ~~LOC~~+RFID (SPONGE) ×2 IMPLANT
STAPLER SKIN PROX WIDE 3.9 (STAPLE) ×2 IMPLANT
STOCKINETTE 6 STRL (DRAPES) IMPLANT
STOCKINETTE ORTHO 6X25 (MISCELLANEOUS) ×2 IMPLANT
SUCTION TUBE FRAZIER 10FR DISP (SUCTIONS) ×1 IMPLANT
SUT ETHILON 2 0 FS 18 (SUTURE) ×3 IMPLANT
SUT MNCRL AB 3-0 PS2 18 (SUTURE) IMPLANT
SUT PDS II 3-0 CT2 27 ABS (SUTURE) IMPLANT
SUT VIC AB 0 CT1 27XBRD ANBCTR (SUTURE) IMPLANT
SUT VIC AB 2-0 CT1 TAPERPNT 27 (SUTURE) ×3 IMPLANT
SUT VIC AB 3-0 SH 27X BRD (SUTURE) IMPLANT
SYR BULB IRRIG 60ML STRL (SYRINGE) ×2 IMPLANT
SYR CONTROL 10ML LL (SYRINGE) IMPLANT
TOWEL GREEN STERILE FF (TOWEL DISPOSABLE) ×4 IMPLANT
TUBE CONNECTING 20X1/4 (TUBING) ×1 IMPLANT
UNDERPAD 30X36 HEAVY ABSORB (UNDERPADS AND DIAPERS) ×2 IMPLANT

## 2023-12-20 NOTE — Anesthesia Procedure Notes (Signed)
 Anesthesia Regional Block: Popliteal block   Pre-Anesthetic Checklist: , timeout performed,  Correct Patient, Correct Site, Correct Laterality,  Correct Procedure, Correct Position, site marked,  Risks and benefits discussed,  Surgical consent,  Pre-op evaluation,  At surgeon's request and post-op pain management  Laterality: Right  Prep: chloraprep       Needles:  Injection technique: Single-shot  Needle Type: Stimiplex     Needle Length: 9cm  Needle Gauge: 21     Additional Needles:   Procedures:,,,, ultrasound used (permanent image in chart),,    Narrative:  Start time: 12/20/2023 9:14 AM End time: 12/20/2023 9:19 AM Injection made incrementally with aspirations every 5 mL.  Performed by: Personally  Anesthesiologist: Cleotilde Butler Dade, MD

## 2023-12-20 NOTE — Transfer of Care (Signed)
 Immediate Anesthesia Transfer of Care Note  Patient: Gina Zimmerman  Procedure(s) Performed: OPEN REDUCTION INTERNAL FIXATION (ORIF) ANKLE FRACTURE (Right: Ankle) REPAIR, SYNDESMOSIS, ANKLE (Right: Ankle)  Patient Location: PACU  Anesthesia Type:General and Regional  Level of Consciousness: awake, alert , and oriented  Airway & Oxygen Therapy: Patient Spontanous Breathing and Patient connected to face mask oxygen  Post-op Assessment: Report given to RN and Post -op Vital signs reviewed and stable  Post vital signs: Reviewed and stable  Last Vitals:  Vitals Value Taken Time  BP 131/88 12/20/23 11:39  Temp    Pulse 96 12/20/23 11:41  Resp 17 12/20/23 11:41  SpO2 100 % 12/20/23 11:41  Vitals shown include unfiled device data.  Last Pain:  Vitals:   12/20/23 0849  TempSrc: Tympanic  PainSc: 1       Patients Stated Pain Goal: 1 (12/20/23 0849)  Complications: No notable events documented.

## 2023-12-20 NOTE — Progress Notes (Signed)
Assisted Dr. Sabra Heck with right, adductor canal, popliteal, ultrasound guided block. Side rails up, monitors on throughout procedure. See vital signs in flow sheet. Tolerated Procedure well.

## 2023-12-20 NOTE — Op Note (Addendum)
 12/20/2023  3:12 PM   PATIENT: Gina Zimmerman  23 y.o. female  MRN: [id number]   PRE-OPERATIVE DIAGNOSIS:   Closed trimalleolar fracture of right ankle, initial encounter   POST-OPERATIVE DIAGNOSIS:   Closed trimalleolar fracture of right ankle, initial encounter   PROCEDURE: 1] Right trimalleolar ankle ORIF with fixation of posterior malleolus 2] Right ankle syndesmosis ORIF   SURGEON:  Lillia Mountain, MD   ASSISTANT: None   ANESTHESIA: General, regional   EBL: Minimal   TOURNIQUET:    Total Tourniquet Time Documented: Thigh (Right) - 72 minutes Total: Thigh (Right) - 72 minutes    COMPLICATIONS: None apparent   DISPOSITION: Extubated, awake and stable to recovery.   INDICATION FOR PROCEDURE: The patient presented with above diagnosis.  We discussed the diagnosis, alternative treatment options, risks and benefits of the above surgical intervention, as well as alternative non-operative treatments. All questions/concerns were addressed and the patient/family demonstrated appropriate understanding of the diagnosis, the procedure, the postoperative course, and overall prognosis. The patient wished to proceed with surgical intervention and signed an informed surgical consent as such, in each others presence prior to surgery.   PROCEDURE IN DETAIL: After preoperative consent was obtained and the correct operative site was identified, the patient was brought to the operating room supine on stretcher and transferred onto operating table. General anesthesia was induced. Preoperative antibiotics were administered. Surgical timeout was taken. The patient was then positioned supine with an ipsilateral hip bump. The operative lower extremity was prepped and draped in standard sterile fashion with a tourniquet around the thigh. The extremity was exsanguinated and the tourniquet was inflated to 275 mmHg.  A standard lateral incision was made over the distal fibula.  Dissection was carried down to the level of the fibula and the fracture site identified. The superficial peroneal nerve was identified and protected throughout the procedure. The fibula was noted to be shortened with interposed periosteum. The fibula was brought out to length. The fibula fracture was debrided and the edges defined to achieve cortical read. Reduction maneuver was performed using pointed reduction forceps and lobster forceps. In this manner, the fibula length was restored and fracture reduced. A lag screw was not placed given the orientation of fracture lines and comminution. Due to poor bone quality and extensive comminution at the fracture site, it was decided to use a locking distal fibula plate. We then selected a Zimmer locking plate to match the anatomy of the distal fibula and placed it laterally. This was implanted under intraoperative fluoroscopy with a combination of distal locking screws and proximal cortical & locking screws.  We then proceeded with reduction and fixation of the posterior distal tibia fragment. We confirmed appropriate reduction using intraoperative fluoroscopy. The posterior distal tibia fragment was reduced directly using window thru the fibula fracture. An anterolateral approach was made over anterior ankle. Dissection carried down to level of distal tibia taking care throughout to protect the nearby tendons and neurovascular structures. A Kirschner wire was used to provisionally fix the fracture. This was overdrilled with cannulated drill and a 4.0 partially threaded screw was implanted. This was noted to achieve compression across the fracture with excellent congruency of the tibial articular surface on fluoroscopy. Position of this screw was verified carefully using intraoperative fluoroscopy throughout.    We then turned to the medial malleolar fracture. After obtaining reduction under fluoroscopy, a Kirschner wire was placed to secure this reduction and to  serve as guide for a cannulated screw. We then made  an incision around the wire and overdrilled this with a cannulated drill. We then placed a 4.0 mm Zimmer [employer] partially threaded lag screw. This screw was noted to achieve excellent compression across the fracture site and also have excellent purchase. We verified position of the screw and fracture reduction in all planes with fluoroscopy.  A manual external rotation stress radiograph was obtained and demonstrated widening of the ankle mortise. Given this intraoperative finding as well as preoperative subluxation, it was decided to reduce and fix the syndesmosis. Therefore a nonlocking quadricortical hex head 4.0 mm screw was implanted through the fibula plate in appropriate fashion to fix the syndesmosis. Screw position was verified along anteromedial tibial cortex by fluoroscopy. A repeat stress radiograph showed complete stability of the ankle mortise to testing. A second screw was implanted to reinforce fixation.  The surgical sites were thoroughly irrigated. The tourniquet was deflated and hemostasis achieved. Betadine  and vancomycin  powder were applied. The deep layers were closed using 2-0 vicryl. The skin was closed without tension.    The leg was cleaned with saline and sterile dressings with gauze were applied. A well padded bulky short leg splint was applied. The patient was awakened from anesthesia and transported to the recovery room in stable condition.    FOLLOW UP PLAN: -transfer to PACU, then home -strict NWB operative extremity, maximum elevation -maintain short leg splint until follow up -DVT ppx: Aspirin 81 mg twice daily while NWB -follow up as outpatient within 7-10 days for wound check with exchange of short leg splint to short leg cast -sutures out in 2-3 weeks in outpatient office   RADIOGRAPHS: AP, lateral, oblique and stress radiographs of the right ankle were obtained intraoperatively. These showed interval reduction  and fixation of the fractures. Manual stress radiographs were taken and the joints were noted to be stable following fixation. All hardware is appropriately positioned and of the appropriate lengths. No other acute injuries are noted.   Lillia Mountain Orthopaedic Surgery [facility]

## 2023-12-20 NOTE — H&P (Addendum)
 H&P Update:  -History and Physical Reviewed  -Patient has been re-examined  -No change in the plan of care  -The risks and benefits were presented and reviewed. The risks due to hardware/suture failure and/or irritation, new/persistent infection, stiffness, nerve/vessel/tendon injury or rerupture of repaired tendon, nonunion/malunion, allograft usage, wound healing issues, development of arthritis, failure of this surgery, possibility of external fixation with delayed definitive surgery, need for further surgery, thromboembolic events, anesthesia/medical complications, amputation, death among others were discussed. The patient acknowledged the explanation, agreed to proceed with the plan and a consent was signed.  ADDENDUM: Pt confirmed she does consent to possible allograft use so will proceed as planned.  Gina Zimmerman

## 2023-12-20 NOTE — Anesthesia Procedure Notes (Signed)
 Procedure Name: LMA Insertion Date/Time: 12/20/2023 10:12 AM  Performed by: Burnard Rosaline HERO, CRNAPre-anesthesia Checklist: Patient identified, Emergency Drugs available, Suction available and Patient being monitored Patient Re-evaluated:Patient Re-evaluated prior to induction Oxygen Delivery Method: Circle system utilized Preoxygenation: Pre-oxygenation with 100% oxygen Induction Type: IV induction Ventilation: Mask ventilation without difficulty LMA: LMA inserted LMA Size: 4.0 Number of attempts: 1 Airway Equipment and Method: Bite block Placement Confirmation: positive ETCO2, CO2 detector and breath sounds checked- equal and bilateral Tube secured with: Tape Dental Injury: Teeth and Oropharynx as per pre-operative assessment

## 2023-12-20 NOTE — Anesthesia Procedure Notes (Signed)
 Anesthesia Regional Block: Adductor canal block   Pre-Anesthetic Checklist: , timeout performed,  Correct Patient, Correct Site, Correct Laterality,  Correct Procedure, Correct Position, site marked,  Risks and benefits discussed,  Surgical consent,  Pre-op evaluation,  At surgeon's request and post-op pain management  Laterality: Right  Prep: chloraprep       Needles:  Injection technique: Single-shot  Needle Type: Stimiplex     Needle Length: 9cm  Needle Gauge: 21     Additional Needles:   Procedures:,,,, ultrasound used (permanent image in chart),,    Narrative:  Start time: 12/20/2023 9:13 AM End time: 12/20/2023 9:18 AM Injection made incrementally with aspirations every 5 mL.  Performed by: Personally  Anesthesiologist: Cleotilde Butler Dade, MD

## 2023-12-20 NOTE — Anesthesia Preprocedure Evaluation (Signed)
 Anesthesia Evaluation  Patient identified by MRN, date of birth, ID band Patient awake    Reviewed: Allergy & Precautions, NPO status , Patient's Chart, lab work & pertinent test results  Airway Mallampati: II  TM Distance: >3 FB Neck ROM: Full    Dental no notable dental hx. (+) Dental Advisory Given   Pulmonary neg pulmonary ROS   Pulmonary exam normal        Cardiovascular negative cardio ROS Normal cardiovascular exam     Neuro/Psych negative neurological ROS  negative psych ROS   GI/Hepatic Neg liver ROS,,,  Endo/Other  negative endocrine ROS    Renal/GU negative Renal ROS     Musculoskeletal negative musculoskeletal ROS (+)    Abdominal   Peds  Hematology negative hematology ROS (+)   Anesthesia Other Findings   Reproductive/Obstetrics negative OB ROS                              Anesthesia Physical Anesthesia Plan  ASA: II  Anesthesia Plan: General   Post-op Pain Management: Regional block*   Induction: Intravenous  PONV Risk Score and Plan: 3 and Ondansetron , Dexamethasone , Midazolam  and Treatment may vary due to age or medical condition  Airway Management Planned: LMA  Additional Equipment: None  Intra-op Plan:   Post-operative Plan: Extubation in OR  Informed Consent: I have reviewed the patients History and Physical, chart, labs and discussed the procedure including the risks, benefits and alternatives for the proposed anesthesia with the patient or authorized representative who has indicated his/her understanding and acceptance.     Dental advisory given  Plan Discussed with: CRNA and Anesthesiologist  Anesthesia Plan Comments:          Anesthesia Quick Evaluation

## 2023-12-21 NOTE — Anesthesia Postprocedure Evaluation (Signed)
 Anesthesia Post Note  Patient: Gina Zimmerman  Procedure(s) Performed: OPEN REDUCTION INTERNAL FIXATION (ORIF) ANKLE FRACTURE (Right: Ankle) REPAIR, SYNDESMOSIS, ANKLE (Right: Ankle)     Patient location during evaluation: PACU Anesthesia Type: General Level of consciousness: awake and alert Pain management: pain level controlled Vital Signs Assessment: post-procedure vital signs reviewed and stable Respiratory status: spontaneous breathing, nonlabored ventilation and respiratory function stable Cardiovascular status: blood pressure returned to baseline and stable Postop Assessment: no apparent nausea or vomiting Anesthetic complications: no   No notable events documented.  Last Vitals:  Vitals:   12/20/23 1215 12/20/23 1242  BP: 124/85 121/80  Pulse: 87 90  Resp: 13   Temp:  (!) 36.2 C  SpO2: 100% 99%    Last Pain:  Vitals:   12/20/23 1242  TempSrc:   PainSc: 0-No pain                 Butler Levander Pinal

## 2023-12-22 ENCOUNTER — Encounter (HOSPITAL_BASED_OUTPATIENT_CLINIC_OR_DEPARTMENT_OTHER): Payer: Self-pay | Admitting: Orthopaedic Surgery

## 2024-01-24 ENCOUNTER — Other Ambulatory Visit (HOSPITAL_COMMUNITY): Payer: Self-pay | Admitting: Orthopaedic Surgery

## 2024-01-24 ENCOUNTER — Ambulatory Visit (HOSPITAL_COMMUNITY)
Admission: RE | Admit: 2024-01-24 | Discharge: 2024-01-24 | Disposition: A | Discharge status: Home / Self Care | Payer: Self-pay | Source: Ambulatory Visit | Attending: Vascular Surgery | Admitting: Vascular Surgery

## 2024-01-24 DIAGNOSIS — M79605 Pain in left leg: Secondary | ICD-10-CM | POA: Insufficient documentation

## 2024-01-24 DIAGNOSIS — M79604 Pain in right leg: Secondary | ICD-10-CM | POA: Insufficient documentation

## 2024-01-24 DIAGNOSIS — M7989 Other specified soft tissue disorders: Secondary | ICD-10-CM | POA: Insufficient documentation
